# Patient Record
Sex: Male | Born: 1995 | Race: Black or African American | Hispanic: No | Smoking: Never smoker
Health system: Southern US, Community
[De-identification: ages and names within clinical notes are randomized; demographics above are authoritative.]

## PROBLEM LIST (undated history)

## (undated) DIAGNOSIS — T7840XA Allergy, unspecified, initial encounter: Secondary | ICD-10-CM

## (undated) DIAGNOSIS — E785 Hyperlipidemia, unspecified: Secondary | ICD-10-CM

## (undated) DIAGNOSIS — I10 Essential (primary) hypertension: Secondary | ICD-10-CM

## (undated) DIAGNOSIS — F419 Anxiety disorder, unspecified: Secondary | ICD-10-CM

## (undated) DIAGNOSIS — J45909 Unspecified asthma, uncomplicated: Secondary | ICD-10-CM

## (undated) DIAGNOSIS — K219 Gastro-esophageal reflux disease without esophagitis: Secondary | ICD-10-CM

## (undated) HISTORY — DX: Allergy, unspecified, initial encounter: T78.40XA

## (undated) HISTORY — DX: Hyperlipidemia, unspecified: E78.5

## (undated) HISTORY — DX: Gastro-esophageal reflux disease without esophagitis: K21.9

## (undated) HISTORY — DX: Anxiety disorder, unspecified: F41.9

## (undated) HISTORY — PX: UPPER GASTROINTESTINAL ENDOSCOPY: SHX188

## (undated) HISTORY — DX: Essential (primary) hypertension: I10

## (undated) HISTORY — PX: NO PAST SURGERIES: SHX2092

---
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
## 2015-05-30 ENCOUNTER — Emergency Department (HOSPITAL_COMMUNITY)
Admission: EM | Admit: 2015-05-30 | Discharge: 2015-05-30 | Disposition: A | Discharge status: Home / Self Care | Payer: BLUE CROSS/BLUE SHIELD | Attending: Emergency Medicine | Admitting: Emergency Medicine

## 2015-05-30 ENCOUNTER — Encounter (HOSPITAL_COMMUNITY): Payer: Self-pay | Admitting: Emergency Medicine

## 2015-05-30 DIAGNOSIS — R110 Nausea: Secondary | ICD-10-CM | POA: Insufficient documentation

## 2015-05-30 DIAGNOSIS — J45909 Unspecified asthma, uncomplicated: Secondary | ICD-10-CM | POA: Diagnosis not present

## 2015-05-30 DIAGNOSIS — R300 Dysuria: Secondary | ICD-10-CM | POA: Diagnosis not present

## 2015-05-30 DIAGNOSIS — R350 Frequency of micturition: Secondary | ICD-10-CM | POA: Insufficient documentation

## 2015-05-30 DIAGNOSIS — N4889 Other specified disorders of penis: Secondary | ICD-10-CM | POA: Diagnosis present

## 2015-05-30 HISTORY — DX: Unspecified asthma, uncomplicated: J45.909

## 2015-05-30 LAB — URINALYSIS, ROUTINE W REFLEX MICROSCOPIC
Bilirubin Urine: NEGATIVE
Glucose, UA: NEGATIVE mg/dL
Hgb urine dipstick: NEGATIVE
Ketones, ur: 40 mg/dL — AB
Leukocytes, UA: NEGATIVE
Nitrite: NEGATIVE
Protein, ur: NEGATIVE mg/dL
Specific Gravity, Urine: 1.009 (ref 1.005–1.030)
pH: 6.5 (ref 5.0–8.0)

## 2015-05-30 LAB — CBG MONITORING, ED: Glucose-Capillary: 81 mg/dL (ref 65–99)

## 2015-05-30 NOTE — ED Provider Notes (Signed)
CSN: [id number]     Arrival date & time 05/30/15  1921 History  By signing my name below, I, Gonzella Lex, attest that this documentation has been prepared under the direction and in the presence of Sharilyn Sites, PA-C. Electronically Signed: Gonzella Lex, Scribe. 05/30/2015. 9:24 PM.   Chief Complaint  Patient presents with  . Penis Pain   The history is provided by the patient. No language interpreter was used.   HPI Comments: Tarron Krolak is a 20 y.o. male who presents to the Emergency Department complaining of frequent urination, penile pain, and mild dysuria and nausea onset five days ago. Pt notes that he has been urinating between 10-15 times a day or more with a normal amount of urine output each time. He states that he has been drinking a lot of water recently due to suspicion of a possible UTI. No hx of DM.  He also reports that he was screened for DM when he was around the age of 22 but has not been screened since. Pt denies penile discharge, testicle pain, fever, vomiting, diarrhea, new sexual partners, and concern for STD. He also denies a personal medical hx and family hx of DM. Pt was offered STD screening in the ED but refused.   Past Medical History  Diagnosis Date  . Asthma    History reviewed. No pertinent past surgical history. Family History  Problem Relation Age of Onset  . Hypertension Other    Social History  Substance Use Topics  . Smoking status: Never Smoker   . Smokeless tobacco: None  . Alcohol Use: No    Review of Systems  Constitutional: Negative for fever.  Gastrointestinal: Positive for nausea. Negative for vomiting and diarrhea.  Genitourinary: Positive for dysuria and frequency. Negative for discharge.  All other systems reviewed and are negative.  Allergies  Robitussin (alcohol free)  Home Medications   Prior to Admission medications   Not on File   BP 158/88 mmHg  Pulse 69  Temp(Src) 98.5 F (36.9 C) (Oral)  Resp 18   Wt 180 lb (81.647 kg)  SpO2 99%   Physical Exam  Constitutional: He is oriented to person, place, and time. He appears well-developed and well-nourished.  HENT:  Head: Normocephalic and atraumatic.  Mouth/Throat: Oropharynx is clear and moist.  Eyes: Conjunctivae and EOM are normal. Pupils are equal, round, and reactive to light.  Neck: Normal range of motion.  Cardiovascular: Normal rate, regular rhythm and normal heart sounds.   Pulmonary/Chest: Effort normal and breath sounds normal. No respiratory distress.  Abdominal: Soft. Bowel sounds are normal.  Genitourinary:  deferred  Musculoskeletal: Normal range of motion.  Neurological: He is alert and oriented to person, place, and time.  Skin: Skin is warm and dry.  Psychiatric: He has a normal mood and affect.  Nursing note and vitals reviewed.   ED Course  Procedures (including critical care time) DIAGNOSTIC STUDIES:    Oxygen Saturation is 99% on RA, normal by my interpretation.   COORDINATION OF CARE:  9:24 PM Will review urinalysis. Will order POC CBG in the ED. Discussed treatment plan with pt at bedside and pt agreed to plan.   Labs Review Labs Reviewed  URINALYSIS, ROUTINE W REFLEX MICROSCOPIC (NOT AT [facility]) - Abnormal; Notable for the following:    Ketones, ur 40 (*)    All other components within normal limits  CBG MONITORING, ED   I have personally reviewed and evaluated these lab results as part of  my medical decision-making.   MDM   Final diagnoses:  Urinary frequency   20 year old male here with urinary frequency. He reports some mild dysuria. Patient afebrile, nontoxic. UA without any signs of infection, there are ketones noted.  Patient denies any new sexual partners, penile discharge, testicle pain, or abdominal pain. He was offered STD testing, however he declined. His CBG is also within normal limits.  Patient was encouraged to drink water to stay hydrated. He is instructed to follow-up with PCP if  symptoms persist.  Discussed plan with patient, he/she acknowledged understanding and agreed with plan of care.  Return precautions given for new or worsening symptoms.  I personally performed the services described in this documentation, which was scribed in my presence. The recorded information has been reviewed and is accurate.  Garlon HatchetLisa M Adelyna Brockman, PA-C 05/30/15 2145  Leta BaptistEmily Roe Nguyen, MD 06/03/15 [phone]

## 2015-05-30 NOTE — ED Notes (Signed)
Pt is c/o penile pain and urinary frequency  Pt states sxs started on Friday and have progressively gotten worse

## 2015-05-30 NOTE — Discharge Instructions (Signed)
Your urine test and blood sugar today were normal. I do recommend that you continue to drink water regularly to stay hydrated. If symptoms persist, please follow-up with a primary care physician in the area.    Emergency Department Resource Guide 1) Find a Doctor and Pay Out of Pocket Although you won't have to find out who is covered by your insurance plan, it is a good idea to ask around and get recommendations. You will then need to call the office and see if the doctor you have chosen will accept you as a new patient and what types of options they offer for patients who are self-pay. Some doctors offer discounts or will set up payment plans for their patients who do not have insurance, but you will need to ask so you aren't surprised when you get to your appointment.  2) Contact Your Local Health Department Not all health departments have doctors that can see patients for sick visits, but many do, so it is worth a call to see if yours does. If you don't know where your local health department is, you can check in your phone book. The CDC also has a tool to help you locate your state's health department, and many state websites also have listings of all of their local health departments.  3) Find a Walk-in Clinic If your illness is not likely to be very severe or complicated, you may want to try a walk in clinic. These are popping up all over the country in pharmacies, drugstores, and shopping centers. They're usually staffed by nurse practitioners or physician assistants that have been trained to treat common illnesses and complaints. They're usually fairly quick and inexpensive. However, if you have serious medical issues or chronic medical problems, these are probably not your best option.  No Primary Care Doctor: - Call Health Connect at  [phone] - they can help you locate a primary care doctor that  accepts your insurance, provides certain services, etc. - Physician Referral Service-  [phone]  Chronic Pain Problems: Organization         Address  Phone   Notes  [facility]  [phone] Patients need to be referred by their primary care doctor.   Medication Assistance: Organization         Address  Phone   Notes  [facility] [address] [place], [place] [address] [phone]) [phone] --Must be a resident of [facility] [place] -- Must have NO insurance coverage whatsoever (no Medicaid/ Medicare, etc.) -- The pt. MUST have a primary care doctor that directs their care regularly and follows them in the community   MedAssist  [phone]   [employer]  [phone]    Agencies that provide inexpensive medical care: Organization         Address  Phone   Notes  [facility]  [phone]) [phone]   [facility]    [phone]) [phone]   [facility] [address] [place], [place] [address] [phone]) [phone]   [facility] [address], [place] [phone]   [facility]anned Parenthood    [phone]) [phone]   [facility]    [phone]   [facility]  [address], [place] Phone:  [phone], Fax:  [phone] Hours of Operation:  9 am - 6 pm, M-F.  Also accepts Medicaid/Medicare and self-pay.  [place]  [facility] for [place], [address], [place] Phone: [phone], Fax: [phone]. Hours of Operation:  8:30 am - 5:30 pm, M-F.  Also accepts Medicaid and self-pay.  [facility] [place] [address] [place] Phone: [phone]   [place], [place], [place] [phone], Ext. [phone] Mondays & Thursdays: 7-9 AM.  First 15 patients are seen on a first come, first serve basis.    [place] Providers:  Organization         Address  Phone   Notes  [facility] [address],  [place] [phone] Also accepts self-pay patients.  [facility] [id number] [place], [place]  [phone]   [place], [address], [place] [phone]   [facility] [address] [place] [phone]   Lucianne Lei [address], [place]   [phone] Only accepts [place] Access [place] patients after they have their name applied to their card.   Self-Pay (no insurance) in [facility]:  Organization         Address  Phone   Notes  Sickle Cell Patients, [facility] [place] [phone]   [facility] [place] [phone]   [facility] [place]  [place], [place], [place] [phone]   [facility]/Dr. Osei-Bonsu  [address], [place] or [address], [place] [phone] Phone number for both [place] and [place] locations is the same.  [facility] [address] [place] [phone]   [facility] [address], [place] or [address] [phone]   [facility] [address], [place] [phone], phone; [phone], fax Sees patients 1st and 3rd Saturday of every month.  Must not qualify for public or private insurance (i.e. Medicaid, Medicare, [facility] Health Choice, Veterans' Benefits)  Household income should be no more than 200% of the poverty level The clinic cannot treat you if you are pregnant or think you are pregnant  Sexually transmitted diseases are not treated at the clinic.    Dental Care: Organization         Address  Phone  Notes  [facility] Department of [facility] [place] [phone] Accepts children up to age 102 who are enrolled in  [place] or [place]; pregnant women with a Medicaid card; and children who have applied for Medicaid or [place] Health Choice, but were declined, whose parents can pay a reduced fee at time of service.  [facility] Department of [facility]  [address], [place] [phone] Accepts children up to age 74 who are enrolled in [place] or [place]; pregnant women with a Medicaid card; and children who have applied for Medicaid or [place] Health Choice, but were declined, whose parents can pay a reduced fee at time of service.  [facility] Access PRO[facility]  [place] [phone] Patients are seen by appointment only. Walk-ins are not accepted. [place] will see patients 40 years of age and older. Monday - Tuesday (8am-5pm) Most Wednesdays (8:30-5pm) $30 per visit, cash  only  [facility] Adult [facility] Access PROGRAM  [address], [facility] [phone] Patients are seen by appointment only. Walk-ins are not accepted. [place] will see patients 14 years of age and older. One Wednesday Evening (Monthly: Volunteer Based).  $30 per visit, cash only  [place]  [phone] for adults; Children under age 48, call Graduate Pediatric Dentistry at [phone]. Children aged 39-14, please call [phone] to request a pediatric application.  Dental services are provided in all areas of dental care including fillings, crowns and bridges, complete and partial dentures, implants, gum treatment, root canals, and extractions. Preventive care is also provided. Treatment is provided to both adults and children. Patients are selected via a lottery and there is often a waiting list.   [facility] [address] [place]  [phone] [email or web address]   [facility] [address] [place], [place] [phone], Ext. [phone] Second and Fourth Thursday of each month, opens at 6:30  AM; Clinic ends at 9 AM.  Patients are seen on a first-come first-served basis, and a limited number are seen during each clinic.   [facility]  [address] Hillard Danker [place], [place] [phone]   Eligibility Requirements You must have lived in [place], [place], or [place] counties for at least the last three months.   You cannot be eligible for state or federal sponsored [employer], including [employer], [place], or [employer].   You generally cannot be eligible for healthcare insurance through your employer.    How to apply: Eligibility screenings are held every Tuesday and Wednesday afternoon from 1:00 pm until 4:00 pm. You do not need an appointment for the interview!  [facility] [address], [place], [place]   [place]  [place] Department  [place]  [phone]    Behavioral Health Resources in the Community: Intensive Outpatient Programs Organization         Address  Phone  Notes  [place]. [address], [place], [place] [phone]   [facility] Outpatient [address] [place], [place]   ADS: Alcohol & Drug Svcs [address], [place], [place]   [place] [address],  [place], [place] or [phone]   Substance Abuse Resources Organization         Address  Phone  Notes  Alcohol and Drug Services  [phone]   [place]  [phone]   The [place]   Chinita Pester  [phone]   Residential & Outpatient Substance Abuse Program  [phone]   Psychological Services Organization         Address  Phone  Notes  [facility] [place]  [place]  [phone]   [place] [address], [place] or  [phone]    Mobile Crisis Teams Organization         Address  Phone  Notes  [facility], Mobile Crisis Care Unit  [phone]   [facility]  [address] [place], [place]   [place] Levels [address] [place] [phone]    Self-Help/Support Groups Organization         Address  Phone  Notes  Mental Health Assoc. of [place] - variety of support groups  [place] Call for more information  Narcotics Anonymous (NA), Caring Services [address], [employer] [place]  2 meetings at this location   Special educational needs teacher         Address  Phone  Notes  [facility] Residential Treatment [place],    [place]  [phone]   [facility]  [address] [place] [phone], [place], [place]   [place], [place] [phone] Admissions: 8am-3pm M-F  Incentives Substance [place] [address],    [place], [place] [phone]   [facility] [address] [place], [place], [place]   [facility] [address]  [place], [place]   [facility] Programs - Intensive Outpatient [address] Kristeen [address], [place], [place]   [facility] ([place].,  [place], [place] [phone] or [phone]   [facility]idential Treatment Services (RTS) [address], [place], [place] Accepts Medicaid  [facility] [place] [address]  [place] [phone] Substance Abuse/Addiction Treatment   [facility] Organization         Address  Phone  Notes  [facility]  [phone]   Domenic Schwab, PhD [address] Arlis Port[place], [place]   [phone] or [phone]   [place], [place] [phone]   [facility] [address],  [place], [place] [phone] Insurance/Medicaid/sponsorship through [facility] and Families [address], [address]                                    [place], [place] [phone] [place] [address] [place], [place] [phone]    Dr. Adele Schilder  [phone]   [facility] Dept. 1) [address] [place] 2) [place] 3)  [place] [address], [place] [phone]  [phone]   [place] [phone] or [phone] (After Hours)

## 2015-06-01 ENCOUNTER — Encounter: Payer: Self-pay | Admitting: Internal Medicine

## 2015-06-01 ENCOUNTER — Ambulatory Visit: Payer: BLUE CROSS/BLUE SHIELD | Admitting: Internal Medicine

## 2015-06-01 VITALS — BP 128/76 | HR 60 | Temp 98.2°F | Resp 16 | Ht 69.75 in | Wt 181.0 lb

## 2015-06-01 DIAGNOSIS — E559 Vitamin D deficiency, unspecified: Secondary | ICD-10-CM | POA: Insufficient documentation

## 2015-06-01 DIAGNOSIS — E785 Hyperlipidemia, unspecified: Secondary | ICD-10-CM | POA: Insufficient documentation

## 2015-06-01 DIAGNOSIS — R300 Dysuria: Secondary | ICD-10-CM

## 2015-06-01 NOTE — Progress Notes (Signed)
  Subjective:    Patient ID: Matthew Wu, male    DOB: 1996/02/04, 20 y.o.   MRN: [id number]  HPI  This nice 20 yo Single BM presents for f/u from Er visit about 2-3 days ago when he was evaluated for c/o dysuria and penile pain. U/A was negative and he was released for f/u. Denies any rash, fever, chills, penile discharge and admits dysuria sx's have resolved.  (reports not sexually active)   Meds- None   All - None  PMHx - Negative  Review of Systems  10 point systems review negative except as above.    Objective:   Physical Exam  BP 128/76 mmHg  Pulse 60  Temp(Src) 98.2 F (36.8 C)  Resp 16  Ht 5' 9.75" (1.772 m)  Wt 181 lb (82.101 kg)  BMI 26.15 kg/m2  GU exsam - No hernias &  phalus normal w/o signs of lesions, ulcerations, rash or deformity.     Assessment & Plan:   1. Dysuria, rresolved  - reassured  - advised of sx's to return for.   (No charge OV)

## 2015-06-18 ENCOUNTER — Encounter: Payer: Self-pay | Admitting: Emergency Medicine

## 2015-06-18 ENCOUNTER — Emergency Department
Admission: EM | Admit: 2015-06-18 | Discharge: 2015-06-18 | Disposition: A | Discharge status: Home / Self Care | Payer: BLUE CROSS/BLUE SHIELD | Attending: Emergency Medicine | Admitting: Emergency Medicine

## 2015-06-18 ENCOUNTER — Emergency Department: Payer: BLUE CROSS/BLUE SHIELD

## 2015-06-18 DIAGNOSIS — Y998 Other external cause status: Secondary | ICD-10-CM | POA: Insufficient documentation

## 2015-06-18 DIAGNOSIS — Y9389 Activity, other specified: Secondary | ICD-10-CM | POA: Insufficient documentation

## 2015-06-18 DIAGNOSIS — S51811A Laceration without foreign body of right forearm, initial encounter: Secondary | ICD-10-CM | POA: Insufficient documentation

## 2015-06-18 DIAGNOSIS — S134XXA Sprain of ligaments of cervical spine, initial encounter: Secondary | ICD-10-CM | POA: Insufficient documentation

## 2015-06-18 DIAGNOSIS — S46911A Strain of unspecified muscle, fascia and tendon at shoulder and upper arm level, right arm, initial encounter: Secondary | ICD-10-CM | POA: Insufficient documentation

## 2015-06-18 DIAGNOSIS — IMO0002 Reserved for concepts with insufficient information to code with codable children: Secondary | ICD-10-CM

## 2015-06-18 DIAGNOSIS — S199XXA Unspecified injury of neck, initial encounter: Secondary | ICD-10-CM | POA: Diagnosis present

## 2015-06-18 DIAGNOSIS — Y92410 Unspecified street and highway as the place of occurrence of the external cause: Secondary | ICD-10-CM | POA: Diagnosis not present

## 2015-06-18 NOTE — ED Notes (Signed)
Spoke to dr Derrill Kay r/t pt. Will hold off imaging orders until seen by EDP

## 2015-06-18 NOTE — ED Notes (Signed)
Pt refusing wheelchair. 
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
## 2015-06-18 NOTE — ED Notes (Signed)
philly collar applied in triage
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
## 2015-06-26 ENCOUNTER — Telehealth: Payer: Self-pay | Admitting: Internal Medicine

## 2015-06-26 NOTE — Telephone Encounter (Signed)
Patient recently in [facility] and wants to know if we recommend chiropractors.  Don't recommend chiropractors as this is not a likely modality that will relieve pain.  He can see [place] here in office and we can send to ortho or can refer to PT.

## 2015-06-26 NOTE — Telephone Encounter (Signed)
LVM for pt to return my call.

## 2015-06-28 NOTE — Telephone Encounter (Signed)
LVM for pt to return office call. 

## 2015-06-28 NOTE — Telephone Encounter (Signed)
Unable to reach pt waiting on pt to return office call

## 2018-01-30 ENCOUNTER — Other Ambulatory Visit: Payer: Self-pay

## 2018-01-30 ENCOUNTER — Emergency Department: Payer: Self-pay

## 2018-01-30 ENCOUNTER — Emergency Department
Admission: EM | Admit: 2018-01-30 | Discharge: 2018-01-30 | Disposition: A | Discharge status: Home / Self Care | Payer: Self-pay | Attending: Emergency Medicine | Admitting: Emergency Medicine

## 2018-01-30 DIAGNOSIS — K219 Gastro-esophageal reflux disease without esophagitis: Secondary | ICD-10-CM | POA: Insufficient documentation

## 2018-01-30 DIAGNOSIS — J45909 Unspecified asthma, uncomplicated: Secondary | ICD-10-CM | POA: Insufficient documentation

## 2018-01-30 DIAGNOSIS — R002 Palpitations: Secondary | ICD-10-CM | POA: Insufficient documentation

## 2018-01-30 DIAGNOSIS — F410 Panic disorder [episodic paroxysmal anxiety] without agoraphobia: Secondary | ICD-10-CM | POA: Insufficient documentation

## 2018-01-30 LAB — CBC
HEMATOCRIT: 44.2 % (ref 40.0–52.0)
Hemoglobin: 15.2 g/dL (ref 13.0–18.0)
MCH: 31 pg (ref 26.0–34.0)
MCHC: 34.4 g/dL (ref 32.0–36.0)
MCV: 90 fL (ref 80.0–100.0)
PLATELETS: 253 10*3/uL (ref 150–440)
RBC: 4.91 MIL/uL (ref 4.40–5.90)
RDW: 13.1 % (ref 11.5–14.5)
WBC: 10.1 10*3/uL (ref 3.8–10.6)

## 2018-01-30 LAB — BASIC METABOLIC PANEL
Anion gap: 8 (ref 5–15)
BUN: 15 mg/dL (ref 6–20)
CHLORIDE: 104 mmol/L (ref 98–111)
CO2: 29 mmol/L (ref 22–32)
CREATININE: 0.87 mg/dL (ref 0.61–1.24)
Calcium: 9 mg/dL (ref 8.9–10.3)
Glucose, Bld: 137 mg/dL — ABNORMAL HIGH (ref 70–99)
POTASSIUM: 3.8 mmol/L (ref 3.5–5.1)
SODIUM: 141 mmol/L (ref 135–145)

## 2018-01-30 LAB — TROPONIN I: Troponin I: <0.03 ng/mL (ref <0.03)

## 2018-01-30 NOTE — ED Provider Notes (Signed)
[facility] Emergency Department Provider Note ____________________________________________   I have reviewed the triage vital signs and the triage nursing note.  HISTORY  Chief Complaint Tachycardia   Historian Patient  HPI Matthew Wu is a 22 y.o. male presenting for evaluation of episodes where he feels like his heart races.  He works night shift at [employer] and states that it is stressful.  He is also expecting a new baby soon.  He states that without known inciting event he will feel his heart race and feel nauseated sometimes feel epigastric discomfort.  He thought that he may have acid reflux because sometimes his stomach burns.  He feels okay right now.  He had several of these episodes yesterday which he thought may have been related to "feeling sick.  "No additional consultation or diarrhea.  No fevers.  No shortness breath or trouble breathing.  States he gets lightheaded when this happens but no true syncope.  No chest pain.     Past Medical History:  Diagnosis Date  . Asthma     Patient Active Problem List   Diagnosis Date Noted  . Vitamin D deficiency 06/01/2015  . Hyperlipidemia 06/01/2015    No past surgical history on file.  Prior to Admission medications   Not on File    Allergies  Allergen Reactions  . Robitussin (Alcohol Free) [Guaifenesin] Nausea And Vomiting    Family History  Problem Relation Age of Onset  . Hypertension Other     Social History Social History   Tobacco Use  . Smoking status: Never Smoker  Substance Use Topics  . Alcohol use: No  . Drug use: No    Review of Systems  Constitutional: Negative for fever. Eyes: Negative for visual changes. ENT: Negative for sore throat. Cardiovascular: Negative for chest pain.  Heart racing as per HPI. Respiratory: Negative for shortness of breath. Gastrointestinal: Positive for epigastric discomfort intermittently without right upper quadrant  pain. Genitourinary: Negative for dysuria. Musculoskeletal: Negative for back pain. Skin: Negative for rash. Neurological: Negative for headache.  ____________________________________________   PHYSICAL EXAM:  VITAL SIGNS: ED Triage Vitals  Enc Vitals Group     BP 01/30/18 0417 (!) 160/102     Pulse Rate 01/30/18 0417 90     Resp 01/30/18 0417 16     Temp 01/30/18 0417 98.3 F (36.8 C)     Temp Source 01/30/18 0417 Oral     SpO2 01/30/18 0417 100 %     Weight 01/30/18 0418 185 lb (83.9 kg)     Height 01/30/18 0418 5\' 9"  (1.753 m)     Head Circumference --      Peak Flow --      Pain Score 01/30/18 0418 5     Pain Loc --      Pain Edu? --      Excl. in GC? --      Constitutional: Alert and oriented.  HEENT      Head: Normocephalic and atraumatic.      Eyes: Conjunctivae are normal. Pupils equal and round.       Ears:         Nose: No congestion/rhinnorhea.      Mouth/Throat: Mucous membranes are moist.      Neck: No stridor. Cardiovascular/Chest: Normal rate, regular rhythm.  No murmurs, rubs, or gallops. Respiratory: Normal respiratory effort without tachypnea nor retractions. Breath sounds are clear and equal bilaterally. No wheezes/rales/rhonchi. Gastrointestinal: Soft. No distention, no guarding, no rebound.  Nontender.    Genitourinary/rectal:Deferred Musculoskeletal: Nontender with normal range of motion in all extremities. No joint effusions.  No lower extremity tenderness.  No edema. Neurologic:  Normal speech and language. No gross or focal neurologic deficits are appreciated. Skin:  Skin is warm, dry and intact. No rash noted. Psychiatric: Reports being somewhat anxious.  Here mood and affect are normal. Speech and behavior are normal. Patient exhibits appropriate insight and judgment.   ____________________________________________  LABS (pertinent positives/negatives) I, Governor Rooksebecca Keldon Lassen, MD the attending physician have reviewed the labs noted below.  Labs  Reviewed  BASIC METABOLIC PANEL - Abnormal; Notable for the following components:      Result Value   Glucose, Bld 137 (*)    All other components within normal limits  CBC  TROPONIN I    ____________________________________________    EKG I, Governor Rooksebecca Arlynn Mcdermid, MD, the attending physician have personally viewed and interpreted all ECGs.  70 bpm.  Normal sinus rhythm with sinus arrhythmia.  Narrow QRS.  Normal axis.  Normal ST and T wave ____________________________________________  RADIOLOGY   Chest x-ray two-view, viewed by me, and I reviewed the radiologist interpretation which is negative for any acute findings. __________________________________________  PROCEDURES  Procedure(s) performed: None  Procedures  Critical Care performed: None   ____________________________________________  ED COURSE / ASSESSMENT AND PLAN  Pertinent labs & imaging results that were available during my care of the patient were reviewed by me and considered in my medical decision making (see chart for details).    Patient reports intermittent episodes of tachycardia along with some symptoms of nausea and occasional dizziness, and we talked through possibilities, does not seem like ACS, laboratory studies are reassuring in terms of no anemia or electrolyte disturbances.  Feel less suspicious for cardiac arrhythmia, however will recommend that he follow-up with cardiology to discuss doing a Holter monitor.  He does describe some symptoms that seem consistent with GERD, and we discussed 2 weeks symptomatic management in terms of dietary changes, over-the-counter Maalox and Prilosec.  I am going refer him to primary care for follow-up, he does not have a primary care doctor right now and does have insurance I will refer him both to the [facility] as well as [facility] for him to choose.  It seems like the most likely thing that explains his entire scenario is panic attacks/stress.  Patient  states that he thinks this probably is panic attacks and he is under quite a bit of stress right now, but no suicidal thoughts or active depression.  We discussed some strategies, as well as I am recommending that he follow-up with primary care as well as I will provide behavioral medicine number for follow-up to [facility].     CONSULTATIONS:   None   Patient / Family / Caregiver informed of clinical course, medical decision-making process, and agree with plan.   I discussed return precautions, follow-up instructions, and discharge instructions with patient and/or family.  Discharge Instructions : Your exam and evaluation are overall reassuring, and as we discussed I am most suspicious that your symptoms are coming from panic attacks and please follow-up with primary care doctor as well as behavioral medicine, [facility] number is provided.  We also discussed likely some symptoms of acid reflux/GERD.  Avoid spicy foods, caffeine, fruits and tomatoes, and please take over-the-counter Maalox as needed on labeling for symptoms.  Please take over-the-counter Prilosec 40 mg once daily for 10 to 14 days.  We discussed because of the heart racing/palpitations,  I am recommending that you follow-up with a cardiologist to discuss whether to try a heart monitor to capture the rhythm.  Return to the emergency room immediately for any worsening condition including weakness, numbness, vomiting blood, black or bloody stool, chest pain, trouble breathing, fever, dizziness or passing out, or any other symptoms concerning to you.    ___________________________________________   FINAL CLINICAL IMPRESSION(S) / ED DIAGNOSES   Final diagnoses:  Palpitations  Panic attack  Gastroesophageal reflux disease, esophagitis presence not specified      ___________________________________________         Note: This dictation was prepared with Dragon dictation. Any transcriptional errors that result from this  process are unintentional    Governor Rooks, MD 01/30/18 [phone]

## 2018-01-30 NOTE — ED Triage Notes (Signed)
Pt states "I get real bad reflux and it makes my heart race". Pt complains of epigastric pain and nausea. Pt appears in no acute distress. Pt states has had symptoms for over three weeks.

## 2018-01-30 NOTE — ED Notes (Signed)
Patient c/o intermittent palpitations and tachycardia X 2-3 weeks. Patient reports the onset usually occurs when he is attempting to sleep or relax. Patient reports accompanying symptoms of chest tightness/burning, nausea, SOB, and weakness. Patient reports that today he felt like he was coming down with some type of infection. Report episode of near syncope.

## 2018-01-30 NOTE — ED Notes (Signed)
Pt alert and oriented X4, active, cooperative, pt in NAD. RR even and unlabored, color WNL.  Pt informed to return if any life threatening symptoms occur.  Discharge and followup instructions reviewed.  

## 2018-01-30 NOTE — Discharge Instructions (Addendum)
Your exam and evaluation are overall reassuring, and as we discussed I am most suspicious that your symptoms are coming from panic attacks and please follow-up with primary care doctor as well as behavioral medicine, [facility] number is provided.  We also discussed likely some symptoms of acid reflux/GERD.  Avoid spicy foods, caffeine, fruits and tomatoes, and please take over-the-counter Maalox as needed on labeling for symptoms.  Please take over-the-counter Prilosec 40 mg once daily for 10 to 14 days.  We discussed because of the heart racing/palpitations, I am recommending that you follow-up with a cardiologist to discuss whether to try a heart monitor to capture the rhythm.  Return to the emergency room immediately for any worsening condition including weakness, numbness, vomiting blood, black or bloody stool, chest pain, trouble breathing, fever, dizziness or passing out, or any other symptoms concerning to you.

## 2018-01-30 NOTE — ED Notes (Signed)
ED Provider at bedside. 

## 2018-05-03 DIAGNOSIS — Z9109 Other allergy status, other than to drugs and biological substances: Secondary | ICD-10-CM | POA: Insufficient documentation

## 2018-06-04 ENCOUNTER — Encounter (HOSPITAL_COMMUNITY): Payer: Self-pay | Admitting: Emergency Medicine

## 2018-06-04 ENCOUNTER — Emergency Department (HOSPITAL_COMMUNITY): Payer: Self-pay

## 2018-06-04 ENCOUNTER — Emergency Department (HOSPITAL_COMMUNITY)
Admission: EM | Admit: 2018-06-04 | Discharge: 2018-06-04 | Disposition: A | Discharge status: Home / Self Care | Payer: Self-pay | Attending: Emergency Medicine | Admitting: Emergency Medicine

## 2018-06-04 ENCOUNTER — Other Ambulatory Visit: Payer: Self-pay

## 2018-06-04 DIAGNOSIS — R200 Anesthesia of skin: Secondary | ICD-10-CM | POA: Insufficient documentation

## 2018-06-04 DIAGNOSIS — R202 Paresthesia of skin: Secondary | ICD-10-CM | POA: Insufficient documentation

## 2018-06-04 DIAGNOSIS — R51 Headache: Secondary | ICD-10-CM | POA: Insufficient documentation

## 2018-06-04 DIAGNOSIS — J45909 Unspecified asthma, uncomplicated: Secondary | ICD-10-CM | POA: Insufficient documentation

## 2018-06-04 LAB — CBC WITH DIFFERENTIAL/PLATELET
ABS IMMATURE GRANULOCYTES: 0.03 10*3/uL (ref 0.00–0.07)
BASOS ABS: 0 10*3/uL (ref 0.0–0.1)
BASOS PCT: 1 %
EOS ABS: 0.2 10*3/uL (ref 0.0–0.5)
Eosinophils Relative: 3 %
HCT: 46.9 % (ref 39.0–52.0)
Hemoglobin: 15.3 g/dL (ref 13.0–17.0)
Immature Granulocytes: 0 %
Lymphocytes Relative: 38 %
Lymphs Abs: 2.9 10*3/uL (ref 0.7–4.0)
MCH: 29.3 pg (ref 26.0–34.0)
MCHC: 32.6 g/dL (ref 30.0–36.0)
MCV: 89.7 fL (ref 80.0–100.0)
MONOS PCT: 9 %
Monocytes Absolute: 0.7 10*3/uL (ref 0.1–1.0)
NEUTROS ABS: 3.7 10*3/uL (ref 1.7–7.7)
NEUTROS PCT: 49 %
NRBC: 0 % (ref 0.0–0.2)
Platelets: 294 10*3/uL (ref 150–400)
RBC: 5.23 MIL/uL (ref 4.22–5.81)
RDW: 12.2 % (ref 11.5–15.5)
WBC: 7.6 10*3/uL (ref 4.0–10.5)

## 2018-06-04 LAB — BASIC METABOLIC PANEL
ANION GAP: 9 (ref 5–15)
BUN: 10 mg/dL (ref 6–20)
CALCIUM: 9.4 mg/dL (ref 8.9–10.3)
CO2: 25 mmol/L (ref 22–32)
Chloride: 105 mmol/L (ref 98–111)
Creatinine, Ser: 0.93 mg/dL (ref 0.61–1.24)
Glucose, Bld: 95 mg/dL (ref 70–99)
POTASSIUM: 3.9 mmol/L (ref 3.5–5.1)
Sodium: 139 mmol/L (ref 135–145)

## 2018-06-04 LAB — MAGNESIUM: MAGNESIUM: 2 mg/dL (ref 1.7–2.4)

## 2018-06-04 NOTE — ED Notes (Addendum)
No focal weakness noted on exam.  Pt is ambulatory to triage. Cap refill <3 sec

## 2018-06-04 NOTE — ED Provider Notes (Addendum)
[facility] EMERGENCY DEPARTMENT Provider Note   CSN: [id number] Arrival date & time: 06/04/18  1059     History   Chief Complaint Chief Complaint  Patient presents with  . Weakness    HPI Matthew Wu is a 23 y.o. male.  23 year old male with prior medical history as detailed below presents for evaluation of intermittent numbness and tingling.  His symptoms have been ongoing for at least the last 6 to 10 weeks.  Patient reports intermittent tingling in both hands.  He reports intermittent tingling into the legs as well.  The symptoms are occasionally associated with headache.  He is currently without complaint.  He denies associated weakness.     The history is provided by the patient and medical records.  Illness  Location:  Paresthesia Severity:  Mild Onset quality:  Gradual Duration:  8 weeks Timing:  Intermittent Progression:  Waxing and waning Chronicity:  Chronic Associated symptoms: no chest pain     Past Medical History:  Diagnosis Date  . Asthma     Patient Active Problem List   Diagnosis Date Noted  . Vitamin D deficiency 06/01/2015  . Hyperlipidemia 06/01/2015    History reviewed. No pertinent surgical history.      Home Medications    Prior to Admission medications   Not on File    Family History Family History  Problem Relation Age of Onset  . Hypertension Other     Social History Social History   Tobacco Use  . Smoking status: Never Smoker  Substance Use Topics  . Alcohol use: No  . Drug use: No     Allergies   Robitussin (alcohol free) [guaifenesin]   Review of Systems Review of Systems  Cardiovascular: Negative for chest pain.  All other systems reviewed and are negative.    Physical Exam Updated Vital Signs BP (!) 172/118 (BP Location: Right Arm)   Pulse (!) 103   Temp 98.2 F (36.8 C) (Oral)   Resp 18   Ht 5\' 9"  (1.753 m)   Wt 89.8 kg   SpO2 100%   BMI 29.24 kg/m   Physical  Exam Vitals signs and nursing note reviewed.  Constitutional:      General: He is not in acute distress.    Appearance: Normal appearance. He is well-developed.  HENT:     Head: Normocephalic and atraumatic.  Eyes:     Conjunctiva/sclera: Conjunctivae normal.     Pupils: Pupils are equal, round, and reactive to light.  Neck:     Musculoskeletal: Normal range of motion and neck supple.  Cardiovascular:     Rate and Rhythm: Normal rate and regular rhythm.     Heart sounds: Normal heart sounds.  Pulmonary:     Effort: Pulmonary effort is normal. No respiratory distress.     Breath sounds: Normal breath sounds.  Abdominal:     General: Abdomen is flat. There is no distension.     Palpations: Abdomen is soft.     Tenderness: There is no abdominal tenderness.  Musculoskeletal: Normal range of motion.        General: No deformity.  Skin:    General: Skin is warm and dry.  Neurological:     General: No focal deficit present.     Mental Status: He is alert and oriented to person, place, and time. Mental status is at baseline.     Cranial Nerves: No cranial nerve deficit.     Sensory: No sensory deficit.  Motor: No weakness.     Coordination: Coordination normal.     Gait: Gait normal.     Deep Tendon Reflexes: Reflexes normal.  Psychiatric:        Mood and Affect: Mood normal.      ED Treatments / Results  Labs (all labs ordered are listed, but only abnormal results are displayed) Labs Reviewed  BASIC METABOLIC PANEL  CBC WITH DIFFERENTIAL/PLATELET  MAGNESIUM    EKG None  Radiology Ct Head Wo Contrast  Result Date: 06/04/2018 CLINICAL DATA:  Headache, no known trauma EXAM: CT HEAD WITHOUT CONTRAST TECHNIQUE: Contiguous axial images were obtained from the base of the skull through the vertex without intravenous contrast. Sagittal and coronal MPR images reconstructed from axial data set. COMPARISON:  06/18/2015 FINDINGS: Brain: Normal ventricular morphology. No midline  shift or mass effect. Normal appearance of brain parenchyma. No intracranial hemorrhage, mass lesion, evidence of acute infarction, or extra-axial fluid collection. Vascular: Normal appearance Skull: Intact Sinuses/Orbits: Clear Other: N/A IMPRESSION: Normal exam. Electronically Signed   By: Ulyses Southward M.D.   On: 06/04/2018 13:29    Procedures Procedures (including critical care time)  Medications Ordered in ED Medications - No data to display   Initial Impression / Assessment and Plan / ED Course  I have reviewed the triage vital signs and the nursing notes.  Pertinent labs & imaging results that were available during my care of the patient were reviewed by me and considered in my medical decision making (see chart for details).     MDM  Screen complete  Patient is presenting with complaint of longstanding issue with transient paresthesias.  Patient is currently without symptoms.  Screening labs obtained in the ED including CT of the head are without significant abnormality.  Patient understands need for close follow-up both with his regular care provider and also with neurology.  Patient has questions about his blood pressure.  He is advised to keep a blood pressure diary at home and to follow-up with his regular care provider about same.  Patient without evidence of endorgan damage from elevated blood pressure in the ED today.  Patient does have some evidence of anxiety.  He is advised that if his anxiety symptoms worsen he should absolutely seek assistance from his regular care provider.  He is always welcome to come to the ED for further evaluation as well.  Importance of close follow-up was repeatedly stressed.  Strict return precautions given and understood.    Final Clinical Impressions(s) / ED Diagnoses   Final diagnoses:  Paresthesias    ED Discharge Orders    None       Wynetta Fines, MD 06/04/18 1350    Wynetta Fines, MD 06/04/18 1350

## 2018-06-04 NOTE — ED Notes (Signed)
Patient verbalizes understanding of discharge instructions. Opportunity for questioning and answers were provided. Armband removed by staff, pt discharged from ED.  

## 2018-06-04 NOTE — ED Notes (Signed)
Patient transported to CT 

## 2018-06-04 NOTE — ED Triage Notes (Signed)
Pt arrives to ED from home with complaints of dizziness since this morning. Pt reports he's been numb and weak in his legs and arms on and off since August. Pt stated the numbness and weakness in his legs was worse than it normally is. Pt placed in position of comfort with bed locked and lowered, call bell in reach.

## 2018-06-04 NOTE — Discharge Instructions (Addendum)
Please return for any problem.  Follow-up with your regular care provider and with neurology as instructed.  Please keep track of your blood pressure at home with a diary as instructed.

## 2018-06-17 NOTE — Progress Notes (Deleted)
FOLLOW UP  Assessment and Plan:    Continue diet and meds as discussed. Further disposition pending results of labs. Discussed med's effects and SE's.   Over 30 minutes of exam, counseling, chart review, and critical decision making was performed.   Future Appointments  Date Time Provider Department Center  06/21/2018 10:00 AM Judd Gaudier, NP [facility] None    ----------------------------------------------------------------------------------------------------------------------  HPI 23 y.o. male with hx of asthma, hyperlipidemia, lost to follow up and last seen in 05/2015 in this office presents to re-establish care and for ER follow up.   He presented to ED on 06/04/2018  for evaluation of intermittent numbness and tingling of bilateral hands and intermittent tingling in legs as well, reportedly ongoing for 6-10 weeks at that time. He reported the symptoms were occasionally associated with HA, but denied associated weakness. CBC, BMP and magnesium levels were normal at that time. CT head w/o contrast was normal without intracranial hemorrhage, mass lesion, evidence of acute infarction, or extra-axial fluid collection. Vasculature appeared normal as well. BP was noted to be elevated at that time,   Recommended follow up with [place] for BP management and follow up, and recommended follow up with neurology ***  ? Too much zinc   Tick exposure ?  Sed /autoimmune?   BMI is There is no height or weight on file to calculate BMI., he {HAS HAS HYI:50277} been working on diet and exercise. Wt Readings from Last 3 Encounters:  06/04/18 198 lb (89.8 kg)  01/30/18 185 lb (83.9 kg)  06/18/15 181 lb (82.1 kg)    His blood pressure {HAS HAS NOT:18834} been controlled at home, today their BP is    He {DOES_DOES AJO:87867} workout. He denies chest pain, shortness of breath, dizziness.   He {ACTION; IS/IS EHM:09470962} on cholesterol medication {Cholesterol meds:21887} and denies myalgias. His  cholesterol {ACTION; IS/IS NOT:21021397} at goal. The cholesterol last visit was:  No results found for: CHOL, HDL, LDLCALC, LDLDIRECT, TRIG, CHOLHDL  He {Has/has not:18111} been working on diet and exercise for prediabetes, and denies {Symptoms; diabetes w/o none:19199}. Last A1C in the office was: No results found for: HGBA1C Patient is on Vitamin D supplement.   No results found for: VD25OH     Ct Head Wo Contrast  Result Date: 06/04/2018 CLINICAL DATA:  Headache, no known trauma EXAM: CT HEAD WITHOUT CONTRAST TECHNIQUE: Contiguous axial images were obtained from the base of the skull through the vertex without intravenous contrast. Sagittal and coronal MPR images reconstructed from axial data set. COMPARISON:  06/18/2015 FINDINGS: Brain: Normal ventricular morphology. No midline shift or mass effect. Normal appearance of brain parenchyma. No intracranial hemorrhage, mass lesion, evidence of acute infarction, or extra-axial fluid collection. Vascular: Normal appearance Skull: Intact Sinuses/Orbits: Clear Other: N/A IMPRESSION: Normal exam. Electronically Signed   By: Ulyses Southward M.D.   On: 06/04/2018 13:29    Current Medications:  No current outpatient medications on file prior to visit.   No current facility-administered medications on file prior to visit.      Allergies:  Allergies  Allergen Reactions  . Robitussin (Alcohol Free) [Guaifenesin] Nausea And Vomiting     Medical History:  Past Medical History:  Diagnosis Date  . Asthma    Family history- Reviewed and unchanged Social history- Reviewed and unchanged   Review of Systems:  ROS    Physical Exam: There were no vitals taken for this visit. Wt Readings from Last 3 Encounters:  06/04/18 198 lb (89.8 kg)  01/30/18 185  lb (83.9 kg)  06/18/15 181 lb (82.1 kg)   General Appearance: Well nourished, in no apparent distress. Eyes: PERRLA, EOMs, conjunctiva no swelling or erythema Sinuses: No Frontal/maxillary  tenderness ENT/Mouth: Ext aud canals clear, TMs without erythema, bulging. No erythema, swelling, or exudate on post pharynx.  Tonsils not swollen or erythematous. Hearing normal.  Neck: Supple, thyroid normal.  Respiratory: Respiratory effort normal, BS equal bilaterally without rales, rhonchi, wheezing or stridor.  Cardio: RRR with no MRGs. Brisk peripheral pulses without edema.  Abdomen: Soft, + BS.  Non tender, no guarding, rebound, hernias, masses. Lymphatics: Non tender without lymphadenopathy.  Musculoskeletal: Full ROM, 5/5 strength, {PSY - GAIT AND STATION:22860} gait Skin: Warm, dry without rashes, lesions, ecchymosis.  Neuro: Cranial nerves intact. No cerebellar symptoms.  Psych: Awake and oriented X 3, normal affect, Insight and Judgment appropriate.    Dan Maker, NP 9:34 AM [facility]

## 2018-06-21 ENCOUNTER — Ambulatory Visit: Payer: Self-pay | Admitting: Adult Health

## 2018-07-15 ENCOUNTER — Encounter: Payer: Self-pay | Admitting: Adult Health

## 2018-07-15 ENCOUNTER — Ambulatory Visit (INDEPENDENT_AMBULATORY_CARE_PROVIDER_SITE_OTHER): Payer: Self-pay | Admitting: Adult Health

## 2018-07-15 VITALS — BP 130/80 | HR 63 | Temp 97.7°F | Wt 197.0 lb

## 2018-07-15 DIAGNOSIS — K219 Gastro-esophageal reflux disease without esophagitis: Secondary | ICD-10-CM | POA: Insufficient documentation

## 2018-07-15 DIAGNOSIS — R42 Dizziness and giddiness: Secondary | ICD-10-CM | POA: Insufficient documentation

## 2018-07-15 DIAGNOSIS — F419 Anxiety disorder, unspecified: Secondary | ICD-10-CM | POA: Insufficient documentation

## 2018-07-15 DIAGNOSIS — I10 Essential (primary) hypertension: Secondary | ICD-10-CM | POA: Insufficient documentation

## 2018-07-15 MED ORDER — CITALOPRAM HYDROBROMIDE 20 MG PO TABS: 20.0000 mg | ORAL_TABLET | Freq: Every day | ORAL | 1 refills | Status: DC

## 2018-07-15 MED ORDER — MECLIZINE HCL 25 MG PO TABS: ORAL_TABLET | ORAL | 1 refills | Status: DC

## 2018-07-15 NOTE — Progress Notes (Signed)
Assessment and Plan:  Matthew Wu was seen today for dizziness.  Diagnoses and all orders for this visit:  Dizziness, nonspecific Unclear etilogy; has had neg head CT Only having with supine, ? Associated with emotional lability, anxiety Will try meclizine, treat anxiety and will follow up closely Patient is self pay, declines recommended neurology follow up patient to go to ER if there is weakness, thunderclap headache, visual changes, or any concerning factors -     meclizine (ANTIVERT) 25 MG tablet; 1/2-1 pill up to 3 times daily for motion sickness/dizziness  Hypertension, unspecified type Currently with mild elevations borderline; not currently on medication Monitor closely, consider BB for heart rate control and benefit with anxiety Monitor blood pressure at home; call if consistently over 130/80 Continue DASH diet.   Reminder to go to the ER if any CP, SOB, nausea, dizziness, severe HA, changes vision/speech, left arm numbness and tingling and jaw pain.  Anxiety Start new medication as prescribed Stress management techniques discussed, increase water, good sleep hygiene discussed, increase exercise, and increase veggies.  Follow up 6-8 weeks, call the office if any new AE's from medications and we will switch them -     citalopram (CELEXA) 20 MG tablet; Take 1 tablet (20 mg total) by mouth daily.   Further disposition pending results of labs. Discussed med's effects and SE's.   Over 15 minutes of exam, counseling, chart review, and critical decision making was performed.   Future Appointments  Date Time Provider Department Center  09/09/2018 10:45 AM Judd Gaudier, NP [facility] None    ------------------------------------------------------------------------------------------------------------------   HPI BP 130/80   Pulse 63   Temp 97.7 F (36.5 C)   Wt 197 lb (89.4 kg)   SpO2 98%   BMI 29.09 kg/m   23 y.o.male with hx of hyperlipidemia, reflux presents for  evaluation of episodes of dizziness, feeling off balance, with some nausea; also having intermittent tinnitis bilaterally. He reports dizziness is worst when he is falling asleep or if he wakes up suddenly during the night when he is supine. He reports this sometimes feels as though the room is spinning. He reports rarely having symptoms during the day, doesn't note association with changes in position.   He reports intermittent headaches, typically bilateral, frontal, achy, he associates these with episodes of emotional lability. He been seen in ED for palpitations, parasthesias, headaches with unremarkable workups including CT on 06/04/2018 and 01/30/2018, was provided with referral to neurology to follow up but patient has not done so due to lack of insurance. He reports these issues are significantly improved.   He does report anxiety has been a significant issue recently, first child (girl) was born recently. He commonly feels anxious, will wake up with sensation of pounding heart (denies chest pain, racing heart, dyspnea). He has not been on medications for this.    Past Medical History:  Diagnosis Date  . Asthma      Allergies  Allergen Reactions  . Robitussin (Alcohol Free) [Guaifenesin] Nausea And Vomiting    Current Outpatient Medications on File Prior to Visit  Medication Sig  . Calcium Carbonate Antacid (TUMS PO) Take by mouth as needed.   No current facility-administered medications on file prior to visit.     ROS: all negative except above.   Physical Exam:  BP 130/80   Pulse 63   Temp 97.7 F (36.5 C)   Wt 197 lb (89.4 kg)   SpO2 98%   BMI 29.09 kg/m   General Appearance:  Well nourished, in no apparent distress. Eyes: PERRLA, EOMs, conjunctiva no swelling or erythema Sinuses: No Frontal/maxillary tenderness ENT/Mouth: Ext aud canals clear, TMs without erythema, bulging. No erythema, swelling, or exudate on post pharynx.  Tonsils not swollen or erythematous.  Hearing normal.  Neck: Supple, thyroid normal.  Respiratory: Respiratory effort normal, BS equal bilaterally without rales, rhonchi, wheezing or stridor.  Cardio: RRR with no MRGs. Brisk peripheral pulses without edema.  Abdomen: Soft, + BS.  Non tender, no guarding, rebound, hernias, masses. Lymphatics: Non tender without lymphadenopathy.  Musculoskeletal: Full ROM, 5/5 strength, normal gait.  Skin: Warm, dry without rashes, lesions, ecchymosis.  Neuro: Cranial nerves intact. Normal muscle tone, no cerebellar symptoms. Sensation intact. Dizziness not elicited during exam.  Psych: Awake and oriented X 3, normal affect, Insight and Judgment appropriate.     Dan Maker, NP 12:35 PM [facility]

## 2018-07-15 NOTE — Patient Instructions (Signed)
Dizziness Dizziness is a common problem. It is a feeling of unsteadiness or light-headedness. You may feel like you are about to faint. Dizziness can lead to injury if you stumble or fall. Anyone can become dizzy, but dizziness is more common in older adults. This condition can be caused by a number of things, including medicines, dehydration, or illness. Follow these instructions at home: Eating and drinking  Drink enough fluid to keep your urine clear or pale yellow. This helps to keep you from becoming dehydrated. Try to drink more clear fluids, such as water.  Do not drink alcohol.  Limit your caffeine intake if told to do so by your health care provider. Check ingredients and nutrition facts to see if a food or beverage contains caffeine.  Limit your salt (sodium) intake if told to do so by your health care provider. Check ingredients and nutrition facts to see if a food or beverage contains sodium. Activity  Avoid making quick movements. ? Rise slowly from chairs and steady yourself until you feel okay. ? In the morning, first sit up on the side of the bed. When you feel okay, stand slowly while you hold onto something until you know that your balance is fine.  If you need to stand in one place for a long time, move your legs often. Tighten and relax the muscles in your legs while you are standing.  Do not drive or use heavy machinery if you feel dizzy.  Avoid bending down if you feel dizzy. Place items in your home so that they are easy for you to reach without leaning over. Lifestyle  Do not use any products that contain nicotine or tobacco, such as cigarettes and e-cigarettes. If you need help quitting, ask your health care provider.  Try to reduce your stress level by using methods such as yoga or meditation. Talk with your health care provider if you need help to manage your stress. General instructions  Watch your dizziness for any changes.  Take over-the-counter and  prescription medicines only as told by your health care provider. Talk with your health care provider if you think that your dizziness is caused by a medicine that you are taking.  Tell a friend or a family member that you are feeling dizzy. If he or she notices any changes in your behavior, have this person call your health care provider.  Keep all follow-up visits as told by your health care provider. This is important. Contact a health care provider if:  Your dizziness does not go away.  Your dizziness or light-headedness gets worse.  You feel nauseous.  You have reduced hearing.  You have new symptoms.  You are unsteady on your feet or you feel like the room is spinning. Get help right away if:  You vomit or have diarrhea and are unable to eat or drink anything.  You have problems talking, walking, swallowing, or using your arms, hands, or legs.  You feel generally weak.  You are not thinking clearly or you have trouble forming sentences. It may take a friend or family member to notice this.  You have chest pain, abdominal pain, shortness of breath, or sweating.  Your vision changes.  You have any bleeding.  You have a severe headache.  You have neck pain or a stiff neck.  You have a fever. These symptoms may represent a serious problem that is an emergency. Do not wait to see if the symptoms will go away. Get medical help   right away. Call your local emergency services (911 in the [place] Do not drive yourself to the hospital. Summary  Dizziness is a feeling of unsteadiness or light-headedness. This condition can be caused by a number of things, including medicines, dehydration, or illness.  Anyone can become dizzy, but dizziness is more common in older adults.  Drink enough fluid to keep your urine clear or pale yellow. Do not drink alcohol.  Avoid making quick movements if you feel dizzy. Monitor your dizziness for any changes. This information is not intended to  replace advice given to you by your health care provider. Make sure you discuss any questions you have with your health care provider. Document Released: 10/29/2000 Document Revised: 06/07/2016 Document Reviewed: 06/07/2016 Elsevier Interactive Patient Education  2019 [employer]   Citalopram tablets What is this medicine? CITALOPRAM (sye TAL oh pram) is a medicine for depression. This medicine may be used for other purposes; ask your health care provider or pharmacist if you have questions. COMMON BRAND NAME(S): Celexa What should I tell my health care provider before I take this medicine? They need to know if you have any of these conditions: -bleeding disorders -bipolar disorder or a family history of bipolar disorder -glaucoma -heart disease -history of irregular heartbeat -kidney disease -liver disease -low levels of magnesium or potassium in the blood -receiving electroconvulsive therapy -seizures -suicidal thoughts, plans, or attempt; a previous suicide attempt by you or a family member -take medicines that treat or prevent blood clots -thyroid disease -an unusual or allergic reaction to citalopram, escitalopram, other medicines, foods, dyes, or preservatives -pregnant or trying to become pregnant -breast-feeding How should I use this medicine? Take this medicine by mouth with a glass of water. Follow the directions on the prescription label. You can take it with or without food. Take your medicine at regular intervals. Do not take your medicine more often than directed. Do not stop taking this medicine suddenly except upon the advice of your doctor. Stopping this medicine too quickly may cause serious side effects or your condition may worsen. A special MedGuide will be given to you by the pharmacist with each prescription and refill. Be sure to read this information carefully each time. Talk to your pediatrician regarding the use of this medicine in children. Special care  may be needed. Patients over 51 years old may have a stronger reaction and need a smaller dose. Overdosage: If you think you have taken too much of this medicine contact a poison control center or emergency room at once. NOTE: This medicine is only for you. Do not share this medicine with others. What if I miss a dose? If you miss a dose, take it as soon as you can. If it is almost time for your next dose, take only that dose. Do not take double or extra doses. What may interact with this medicine? Do not take this medicine with any of the following medications: -certain medicines for fungal infections like fluconazole, itraconazole, ketoconazole, posaconazole, voriconazole -cisapride -dofetilide -dronedarone -escitalopram -linezolid -MAOIs like Carbex, Eldepryl, Marplan, Nardil, and Parnate -methylene blue (injected into a vein) -pimozide -thioridazine -ziprasidone This medicine may also interact with the following medications: -alcohol -amphetamines -aspirin and aspirin-like medicines -carbamazepine -certain medicines for depression, anxiety, or psychotic disturbances -certain medicines for infections like chloroquine, clarithromycin, erythromycin, furazolidone, isoniazid, pentamidine -certain medicines for migraine headaches like almotriptan, eletriptan, frovatriptan, naratriptan, rizatriptan, sumatriptan, zolmitriptan -certain medicines for sleep -certain medicines that treat or prevent blood clots like dalteparin, enoxaparin,  warfarin -cimetidine -diuretics -fentanyl -lithium -methadone -metoprolol -NSAIDs, medicines for pain and inflammation, like ibuprofen or naproxen -omeprazole -other medicines that prolong the QT interval (cause an abnormal heart rhythm) -procarbazine -rasagiline -supplements like St. John's wort, kava kava, valerian -tramadol -tryptophan This list may not describe all possible interactions. Give your health care provider a list of all the  medicines, herbs, non-prescription drugs, or dietary supplements you use. Also tell them if you smoke, drink alcohol, or use illegal drugs. Some items may interact with your medicine. What should I watch for while using this medicine? Tell your doctor if your symptoms do not get better or if they get worse. Visit your doctor or health care professional for regular checks on your progress. Because it may take several weeks to see the full effects of this medicine, it is important to continue your treatment as prescribed by your doctor. Patients and their families should watch out for new or worsening thoughts of suicide or depression. Also watch out for sudden changes in feelings such as feeling anxious, agitated, panicky, irritable, hostile, aggressive, impulsive, severely restless, overly excited and hyperactive, or not being able to sleep. If this happens, especially at the beginning of treatment or after a change in dose, call your health care professional. Bonita QuinYou may get drowsy or dizzy. Do not drive, use machinery, or do anything that needs mental alertness until you know how this medicine affects you. Do not stand or sit up quickly, especially if you are an older patient. This reduces the risk of dizzy or fainting spells. Alcohol may interfere with the effect of this medicine. Avoid alcoholic drinks. Your mouth may get dry. Chewing sugarless gum or sucking hard candy, and drinking plenty of water will help. Contact your doctor if the problem does not go away or is severe. What side effects may I notice from receiving this medicine? Side effects that you should report to your doctor or health care professional as soon as possible: -allergic reactions like skin rash, itching or hives, swelling of the face, lips, or tongue -anxious -black, tarry stools -breathing problems -changes in vision -chest pain -confusion -elevated mood, decreased need for sleep, racing thoughts, impulsive behavior -eye  pain -fast, irregular heartbeat -feeling faint or lightheaded, falls -feeling agitated, angry, or irritable -hallucination, loss of contact with reality -loss of balance or coordination -loss of memory -painful or prolonged erections -restlessness, pacing, inability to keep still -seizures -stiff muscles -suicidal thoughts or other mood changes -trouble sleeping -unusual bleeding or bruising -unusually weak or tired -vomiting Side effects that usually do not require medical attention (report to your doctor or health care professional if they continue or are bothersome): -change in appetite or weight -change in sex drive or performance -dizziness -headache -increased sweating -indigestion, nausea -tremors This list may not describe all possible side effects. Call your doctor for medical advice about side effects. You may report side effects to FDA at 1-800-FDA-1088. Where should I keep my medicine? Keep out of reach of children. Store at room temperature between 15 and 30 degrees C (59 and 86 degrees F). Throw away any unused medicine after the expiration date. NOTE: This sheet is a summary. It may not cover all possible information. If you have questions about this medicine, talk to your doctor, pharmacist, or health care provider.  2019 Elsevier/Gold Standard (2015-10-08 13:18:52)   Meclizine tablets or capsules What is this medicine? MECLIZINE (MEK li zeen) is an antihistamine. It is used to prevent nausea, vomiting, or dizziness  caused by motion sickness. It is also used to prevent and treat vertigo (extreme dizziness or a feeling that you or your surroundings are tilting or spinning around). This medicine may be used for other purposes; ask your health care provider or pharmacist if you have questions. COMMON BRAND NAME(S): Antivert, Dramamine Less Drowsy, Dramamine-N, Medivert, Meni-D What should I tell my health care provider before I take this medicine? They need to know  if you have any of these conditions: -glaucoma -lung or breathing disease, like asthma -problems urinating -prostate disease -stomach or intestine problems -an unusual or allergic reaction to meclizine, other medicines, foods, dyes, or preservatives -pregnant or trying to get pregnant -breast-feeding How should I use this medicine? Take this medicine by mouth with a glass of water. Follow the directions on the prescription label. If you are using this medicine to prevent motion sickness, take the dose at least 1 hour before travel. If it upsets your stomach, take it with food or milk. Take your doses at regular intervals. Do not take your medicine more often than directed. Talk to your pediatrician regarding the use of this medicine in children. Special care may be needed. Overdosage: If you think you have taken too much of this medicine contact a poison control center or emergency room at once. NOTE: This medicine is only for you. Do not share this medicine with others. What if I miss a dose? If you miss a dose, take it as soon as you can. If it is almost time for your next dose, take only that dose. Do not take double or extra doses. What may interact with this medicine? Do not take this medicine with any of the following medications: -MAOIs like Carbex, Eldepryl, Marplan, Nardil, and Parnate This medicine may also interact with the following medications: -alcohol -antihistamines for allergy, cough and cold -certain medicines for anxiety or sleep -certain medicines for depression, like amitriptyline, fluoxetine, sertraline -certain medicines for seizures like phenobarbital, primidone -general anesthetics like halothane, isoflurane, methoxyflurane, propofol -local anesthetics like lidocaine, pramoxine, tetracaine -medicines that relax muscles for surgery -narcotic medicines for pain -phenothiazines like chlorpromazine, mesoridazine, prochlorperazine, thioridazine This list may not  describe all possible interactions. Give your health care provider a list of all the medicines, herbs, non-prescription drugs, or dietary supplements you use. Also tell them if you smoke, drink alcohol, or use illegal drugs. Some items may interact with your medicine. What should I watch for while using this medicine? Tell your doctor or healthcare professional if your symptoms do not start to get better or if they get worse. You may get drowsy or dizzy. Do not drive, use machinery, or do anything that needs mental alertness until you know how this medicine affects you. Do not stand or sit up quickly, especially if you are an older patient. This reduces the risk of dizzy or fainting spells. Alcohol may interfere with the effect of this medicine. Avoid alcoholic drinks. Your mouth may get dry. Chewing sugarless gum or sucking hard candy, and drinking plenty of water may help. Contact your doctor if the problem does not go away or is severe. This medicine may cause dry eyes and blurred vision. If you wear contact lenses you may feel some discomfort. Lubricating drops may help. See your eye doctor if the problem does not go away or is severe. What side effects may I notice from receiving this medicine? Side effects that you should report to your doctor or health care professional as soon as possible: -  feeling faint or lightheaded, falls -fast, irregular heartbeat Side effects that usually do not require medical attention (report to your doctor or health care professional if they continue or are bothersome): -constipation -headache -trouble passing urine or change in the amount of urine -trouble sleeping -upset stomach This list may not describe all possible side effects. Call your doctor for medical advice about side effects. You may report side effects to FDA at 1-800-FDA-1088. Where should I keep my medicine? Keep out of the reach of children. Store at room temperature between 15 and 30 degrees C  (59 and 86 degrees F). Keep container tightly closed. Throw away any unused medicine after the expiration date. NOTE: This sheet is a summary. It may not cover all possible information. If you have questions about this medicine, talk to your doctor, pharmacist, or health care provider.  2019 Elsevier/Gold Standard (2015-06-06 19:41:02)    HYPERTENSION INFORMATION  Monitor your blood pressure at home, please keep a record and bring that in with you to your next office visit.   Go to the ER if any CP, SOB, nausea, dizziness, severe HA, changes vision/speech   Your most recent BP: BP: 130/80   Maintain a healthy weight. Get at least 150 minutes of aerobic exercise per week. Minimize salt intake. Minimize alcohol intake  DASH Eating Plan DASH stands for "Dietary Approaches to Stop Hypertension." The DASH eating plan is a healthy eating plan that has been shown to reduce high blood pressure (hypertension). Additional health benefits may include reducing the risk of type 2 diabetes mellitus, heart disease, and stroke. The DASH eating plan may also help with weight loss. WHAT DO I NEED TO KNOW ABOUT THE DASH EATING PLAN? For the DASH eating plan, you will follow these general guidelines:  Choose foods with a percent daily value for sodium of less than 5% (as listed on the food label).  Use salt-free seasonings or herbs instead of table salt or sea salt.  Check with your health care provider or pharmacist before using salt substitutes.  Eat lower-sodium products, often labeled as "lower sodium" or "no salt added."  Eat fresh foods.  Eat more vegetables, fruits, and low-fat dairy products.  Choose whole grains. Look for the word "whole" as the first word in the ingredient list.  Choose fish and skinless chicken or [place] more often than red meat. Limit fish, poultry, and meat to 6 oz (170 g) each day.  Limit sweets, desserts, sugars, and sugary drinks.  Choose heart-healthy  fats.  Limit cheese to 1 oz (28 g) per day.  Eat more home-cooked food and less restaurant, buffet, and fast food.  Limit fried foods.  Cook foods using methods other than frying.  Limit canned vegetables. If you do use them, rinse them well to decrease the sodium.  When eating at a restaurant, ask that your food be prepared with less salt, or no salt if possible. WHAT FOODS CAN I EAT? Seek help from a dietitian for individual calorie needs. Grains Whole grain or whole wheat bread. Brown rice. Whole grain or whole wheat pasta. Quinoa, bulgur, and whole grain cereals. Low-sodium cereals. Corn or whole wheat flour tortillas. Whole grain cornbread. Whole grain crackers. Low-sodium crackers. Vegetables Fresh or frozen vegetables (raw, steamed, roasted, or grilled). Low-sodium or reduced-sodium tomato and vegetable juices. Low-sodium or reduced-sodium tomato sauce and paste. Low-sodium or reduced-sodium canned vegetables.  Fruits All fresh, canned (in natural juice), or frozen fruits. Meat and Other Protein Products Ground beef (85%  or leaner), grass-fed beef, or beef trimmed of fat. Skinless chicken or [place]. Ground chicken or [place]. Pork trimmed of fat. All fish and seafood. Eggs. Dried beans, peas, or lentils. Unsalted nuts and seeds. Unsalted canned beans. Dairy Low-fat dairy products, such as skim or 1% milk, 2% or reduced-fat cheeses, low-fat ricotta or cottage cheese, or plain low-fat yogurt. Low-sodium or reduced-sodium cheeses. Fats and Oils Tub margarines without trans fats. Light or reduced-fat mayonnaise and salad dressings (reduced sodium). Avocado. Safflower, olive, or canola oils. Natural peanut or almond butter. Other Unsalted popcorn and pretzels. The items listed above may not be a complete list of recommended foods or beverages. Contact your dietitian for more options. WHAT FOODS ARE NOT RECOMMENDED? Grains White bread. White pasta. White rice. Refined cornbread.  Bagels and croissants. Crackers that contain trans fat. Vegetables Creamed or fried vegetables. Vegetables in a cheese sauce. Regular canned vegetables. Regular canned tomato sauce and paste. Regular tomato and vegetable juices. Fruits Dried fruits. Canned fruit in light or heavy syrup. Fruit juice. Meat and Other Protein Products Fatty cuts of meat. Ribs, chicken wings, bacon, sausage, bologna, salami, chitterlings, fatback, hot dogs, bratwurst, and packaged luncheon meats. Salted nuts and seeds. Canned beans with salt. Dairy Whole or 2% milk, cream, half-and-half, and cream cheese. Whole-fat or sweetened yogurt. Full-fat cheeses or blue cheese. Nondairy creamers and whipped toppings. Processed cheese, cheese spreads, or cheese curds. Condiments Onion and garlic salt, seasoned salt, table salt, and sea salt. Canned and packaged gravies. Worcestershire sauce. Tartar sauce. Barbecue sauce. Teriyaki sauce. Soy sauce, including reduced sodium. Steak sauce. Fish sauce. Oyster sauce. Cocktail sauce. Horseradish. Ketchup and mustard. Meat flavorings and tenderizers. Bouillon cubes. Hot sauce. Tabasco sauce. Marinades. Taco seasonings. Relishes. Fats and Oils Butter, stick margarine, lard, shortening, ghee, and bacon fat. Coconut, palm kernel, or palm oils. Regular salad dressings. Other Pickles and olives. Salted popcorn and pretzels. The items listed above may not be a complete list of foods and beverages to avoid. Contact your dietitian for more information. WHERE CAN I FIND MORE INFORMATION? National Heart, Lung, and Blood Institute: [employer] Document Released: 04/24/2011 Document Revised: 09/19/2013 Document Reviewed: 03/09/2013 [facility] Patient Information 2015 [place], [place]. This information is not intended to replace advice given to you by your health care provider. Make sure you discuss any questions you have with your health care provider.

## 2018-09-08 DIAGNOSIS — Z6830 Body mass index (BMI) 30.0-30.9, adult: Secondary | ICD-10-CM | POA: Insufficient documentation

## 2018-09-08 NOTE — Progress Notes (Signed)
Assessment and Plan:   Matthew Wu was seen today for follow-up.  Diagnoses and all orders for this visit:  Hypertension, unspecified type Fairly controlled at this time Discussed DASH diet, lifestyle changes   Reminder to go to the ER if any CP, SOB, nausea, dizziness, severe HA, changes vision/speech, left arm numbness and tingling and jaw pain. -     CBC with Differential/Platelet -     COMPLETE METABOLIC PANEL WITH GFR -     Urinalysis, Routine w reflex microscopic -     TSH  Dizziness, nonspecific I suspect inner ear etiology or multifactoral  continue pushing fluids Suggested adding allergy medication, try very low sodium diet Continue pushing hydration Contact office with any new/different symptoms Overall reports is improving, neg head CT, monitor  -     CBC with Differential/Platelet -     COMPLETE METABOLIC PANEL WITH GFR -     Urinalysis, Routine w reflex microscopic  Fatigue Check labs, add vitamin D, B12 or b complex supplement Diet/lifestyle discussed -     CBC with Differential/Platelet -     COMPLETE METABOLIC PANEL WITH GFR -     Urinalysis, Routine w reflex microscopic -     TSH  Anxiety Resolved/improved Stress management techniques discussed, increase water, good sleep hygiene discussed, increase exercise, and increase veggies.   Overweight (BMI 25.0-29.9) Continue to recommend diet heavy in fruits and veggies and low in animal meats, cheeses, and dairy products, appropriate calorie intake Discuss exercise recommendations routinely Continue to monitor weight at each visit   Further disposition pending results of labs. Discussed med's effects and SE's.   Over 30 minutes of exam, counseling, chart review, and critical decision making was performed.   No future appointments.  ------------------------------------------------------------------------------------------------------------------   HPI BP 126/82   Pulse 67   Temp (!) 97.5 F (36.4 C)   Wt  209 lb (94.8 kg)   SpO2 97%   BMI 30.86 kg/m   23 y.o.male with hx of elevated BPs, reflux presents for 8 week follow up of episodes of dizziness, htn and anxiety; Patient is cash pay.   He reported feeling off balance, with some nausea; also having intermittent tinnitis bilaterally. He reported dizziness worst when he is falling asleep or if he wakes up suddenly during the night when he is supine. He reports this sometimes feels as though the room is spinning. He reports rarely having symptoms during the day, does endorse worst with abrupt changes in position. He has been taking meclizine PRN, this does help dizziness significantly, but reports residual "fatigue" - feels heavy all over, resolves quickly once he gets up and going.   He been seen in ED for palpitations, parasthesias, headaches with unremarkable workups including CT on 06/04/2018 and 01/30/2018, was provided with referral to neurology to follow up but patient has not done so due to lack of insurance. He reports these issues are significantly improved.   He had reported he was having anxiety, first child (girl) was born recently, 2 months ago. He reported commonly feeling anxious, will wake up with sensation of pounding heart (denies chest pain, racing heart, dyspnea). He was initiated on zoloft, but reports didn't feel himself on med and stopped after a few days. He feels anxiety is improved/resolved and managing well.   His has hx of mildly elevated BPs, today their BP is BP: 126/82  He does not workout. He denies chest pain, shortness of breath. Does have episodic dizziness. Does get somewhat short of  breath after climbing stairs.   Today he reports remote history of liver enzyme elevation that he never followed up on.   Past Medical History:  Diagnosis Date  . Asthma      Allergies  Allergen Reactions  . Robitussin (Alcohol Free) [Guaifenesin] Nausea And Vomiting    Current Outpatient Medications on File Prior to Visit   Medication Sig  . Calcium Carbonate Antacid (TUMS PO) Take by mouth as needed.  . meclizine (ANTIVERT) 25 MG tablet 1/2-1 pill up to 3 times daily for motion sickness/dizziness  . citalopram (CELEXA) 20 MG tablet Take 1 tablet (20 mg total) by mouth daily. (Patient not taking: Reported on 09/09/2018)   No current facility-administered medications on file prior to visit.     ROS: Review of Systems  Constitutional: Negative for malaise/fatigue and weight loss.  HENT: Positive for tinnitus. Negative for congestion, ear pain, hearing loss, sinus pain and sore throat.   Eyes: Negative for blurred vision and double vision.  Respiratory: Negative for cough, shortness of breath and wheezing.   Cardiovascular: Negative for chest pain, palpitations, orthopnea, claudication and leg swelling.  Gastrointestinal: Negative for abdominal pain, blood in stool, constipation, diarrhea, heartburn, melena, nausea and vomiting.  Genitourinary: Negative.   Musculoskeletal: Negative for falls, joint pain and myalgias.  Skin: Negative for rash.  Neurological: Positive for dizziness. Negative for tingling, sensory change, weakness and headaches.  Endo/Heme/Allergies: Positive for environmental allergies. Negative for polydipsia.  Psychiatric/Behavioral: Negative.  Negative for depression, memory loss and substance abuse. The patient is not nervous/anxious and does not have insomnia.   All other systems reviewed and are negative.    Physical Exam:  BP 126/82   Pulse 67   Temp (!) 97.5 F (36.4 C)   Wt 209 lb (94.8 kg)   SpO2 97%   BMI 30.86 kg/m   General Appearance: Well nourished, in no apparent distress. Eyes: PERRLA, EOMs, conjunctiva no swelling or erythema Sinuses: No Frontal/maxillary tenderness ENT/Mouth: Ext aud canals clear, TMs without erythema, bulging. No erythema, swelling, or exudate on post pharynx.  Tonsils not swollen or erythematous. Hearing normal.  Neck: Supple, thyroid normal.   Respiratory: Respiratory effort normal, BS equal bilaterally without rales, rhonchi, wheezing or stridor.  Cardio: RRR with no MRGs. Brisk peripheral pulses without edema.  Abdomen: Soft, + BS.  Non tender, no guarding, rebound, hernias, masses. Lymphatics: Non tender without lymphadenopathy.  Musculoskeletal: Full ROM, 5/5 strength, normal gait.  Skin: Warm, dry without rashes, lesions, ecchymosis.  Neuro: Cranial nerves intact. Normal muscle tone, no cerebellar symptoms. Sensation intact. Dizziness not elicited during exam.  Psych: Awake and oriented X 3, normal affect, Insight and Judgment appropriate.     Dan Maker, NP 11:17 AM [facility]

## 2018-09-09 ENCOUNTER — Encounter: Payer: Self-pay | Admitting: Adult Health

## 2018-09-09 ENCOUNTER — Ambulatory Visit (INDEPENDENT_AMBULATORY_CARE_PROVIDER_SITE_OTHER): Payer: Self-pay | Admitting: Adult Health

## 2018-09-09 ENCOUNTER — Other Ambulatory Visit: Payer: Self-pay

## 2018-09-09 VITALS — BP 126/82 | HR 67 | Temp 97.5°F | Wt 209.0 lb

## 2018-09-09 DIAGNOSIS — E559 Vitamin D deficiency, unspecified: Secondary | ICD-10-CM

## 2018-09-09 DIAGNOSIS — E785 Hyperlipidemia, unspecified: Secondary | ICD-10-CM

## 2018-09-09 DIAGNOSIS — R42 Dizziness and giddiness: Secondary | ICD-10-CM

## 2018-09-09 DIAGNOSIS — F419 Anxiety disorder, unspecified: Secondary | ICD-10-CM

## 2018-09-09 DIAGNOSIS — Z6830 Body mass index (BMI) 30.0-30.9, adult: Secondary | ICD-10-CM

## 2018-09-09 DIAGNOSIS — I10 Essential (primary) hypertension: Secondary | ICD-10-CM

## 2018-09-09 NOTE — Patient Instructions (Addendum)
Goals    . DIET - INCREASE WATER INTAKE     5-6 bottles daily     . Exercise 150 min/wk Moderate Activity      Continue multivitamin   Recommend getting on vitamin D supplement daily    Several things to try below - gradually introduce these changes - add every 2 weeks and watch to see if there's a significant improvement with any particular change  Try adding daily allergy medication (I like generic allegra - fexofenadine)  Then try very low sodium diet - avoid processed/canned foods - frozen is usually ok as long as its veggies without seasoning - for a few weeks and see if this makes a big difference in your dizziness episodes  Consider adding sublingual B12 supplment or b complex supplement     Your most recent BP: BP: 126/82   Maintain a healthy weight. Get at least 150 minutes of aerobic exercise per week. Minimize salt intake. Minimize alcohol intake  DASH Eating Plan DASH stands for "Dietary Approaches to Stop Hypertension." The DASH eating plan is a healthy eating plan that has been shown to reduce high blood pressure (hypertension). Additional health benefits may include reducing the risk of type 2 diabetes mellitus, heart disease, and stroke. The DASH eating plan may also help with weight loss. WHAT DO I NEED TO KNOW ABOUT THE DASH EATING PLAN? For the DASH eating plan, you will follow these general guidelines:  Choose foods with a percent daily value for sodium of less than 5% (as listed on the food label).  Use salt-free seasonings or herbs instead of table salt or sea salt.  Check with your health care provider or pharmacist before using salt substitutes.  Eat lower-sodium products, often labeled as "lower sodium" or "no salt added."  Eat fresh foods.  Eat more vegetables, fruits, and low-fat dairy products.  Choose whole grains. Look for the word "whole" as the first word in the ingredient list.  Choose fish and skinless chicken or [place] more often  than red meat. Limit fish, poultry, and meat to 6 oz (170 g) each day.  Limit sweets, desserts, sugars, and sugary drinks.  Choose heart-healthy fats.  Limit cheese to 1 oz (28 g) per day.  Eat more home-cooked food and less restaurant, buffet, and fast food.  Limit fried foods.  Cook foods using methods other than frying.  Limit canned vegetables. If you do use them, rinse them well to decrease the sodium.  When eating at a restaurant, ask that your food be prepared with less salt, or no salt if possible. WHAT FOODS CAN I EAT? Seek help from a dietitian for individual calorie needs. Grains Whole grain or whole wheat bread. Brown rice. Whole grain or whole wheat pasta. Quinoa, bulgur, and whole grain cereals. Low-sodium cereals. Corn or whole wheat flour tortillas. Whole grain cornbread. Whole grain crackers. Low-sodium crackers. Vegetables Fresh or frozen vegetables (raw, steamed, roasted, or grilled). Low-sodium or reduced-sodium tomato and vegetable juices. Low-sodium or reduced-sodium tomato sauce and paste. Low-sodium or reduced-sodium canned vegetables.  Fruits All fresh, canned (in natural juice), or frozen fruits. Meat and Other Protein Products Ground beef (85% or leaner), grass-fed beef, or beef trimmed of fat. Skinless chicken or [place]. Ground chicken or [place]. Pork trimmed of fat. All fish and seafood. Eggs. Dried beans, peas, or lentils. Unsalted nuts and seeds. Unsalted canned beans. Dairy Low-fat dairy products, such as skim or 1% milk, 2% or reduced-fat cheeses, low-fat ricotta or cottage  cheese, or plain low-fat yogurt. Low-sodium or reduced-sodium cheeses. Fats and Oils Tub margarines without trans fats. Light or reduced-fat mayonnaise and salad dressings (reduced sodium). Avocado. Safflower, olive, or canola oils. Natural peanut or almond butter. Other Unsalted popcorn and pretzels. The items listed above may not be a complete list of recommended foods or  beverages. Contact your dietitian for more options. WHAT FOODS ARE NOT RECOMMENDED? Grains White bread. White pasta. White rice. Refined cornbread. Bagels and croissants. Crackers that contain trans fat. Vegetables Creamed or fried vegetables. Vegetables in a cheese sauce. Regular canned vegetables. Regular canned tomato sauce and paste. Regular tomato and vegetable juices. Fruits Dried fruits. Canned fruit in light or heavy syrup. Fruit juice. Meat and Other Protein Products Fatty cuts of meat. Ribs, chicken wings, bacon, sausage, bologna, salami, chitterlings, fatback, hot dogs, bratwurst, and packaged luncheon meats. Salted nuts and seeds. Canned beans with salt. Dairy Whole or 2% milk, cream, half-and-half, and cream cheese. Whole-fat or sweetened yogurt. Full-fat cheeses or blue cheese. Nondairy creamers and whipped toppings. Processed cheese, cheese spreads, or cheese curds. Condiments Onion and garlic salt, seasoned salt, table salt, and sea salt. Canned and packaged gravies. Worcestershire sauce. Tartar sauce. Barbecue sauce. Teriyaki sauce. Soy sauce, including reduced sodium. Steak sauce. Fish sauce. Oyster sauce. Cocktail sauce. Horseradish. Ketchup and mustard. Meat flavorings and tenderizers. Bouillon cubes. Hot sauce. Tabasco sauce. Marinades. Taco seasonings. Relishes. Fats and Oils Butter, stick margarine, lard, shortening, ghee, and bacon fat. Coconut, palm kernel, or palm oils. Regular salad dressings. Other Pickles and olives. Salted popcorn and pretzels. The items listed above may not be a complete list of foods and beverages to avoid. Contact your dietitian for more information. WHERE CAN I FIND MORE INFORMATION? National Heart, [employer], and [employer] Institute: [employer] Document Released: 04/24/2011 Document Revised: 09/19/2013 Document Reviewed: 03/09/2013 [facility] Patient Information 2015 [place], [place]. This information is not  intended to replace advice given to you by your health care provider. Make sure you discuss any questions you have with your health care provider.

## 2018-09-10 LAB — URINALYSIS, ROUTINE W REFLEX MICROSCOPIC
Bilirubin Urine: NEGATIVE
Glucose, UA: NEGATIVE
Hgb urine dipstick: NEGATIVE
Ketones, ur: NEGATIVE
Leukocytes,Ua: NEGATIVE
Nitrite: NEGATIVE
Protein, ur: NEGATIVE
Specific Gravity, Urine: 1.015 (ref 1.001–1.03)
pH: 6.5 (ref 5.0–8.0)

## 2018-09-10 LAB — CBC WITH DIFFERENTIAL/PLATELET
Absolute Monocytes: 740 cells/uL (ref 200–950)
Basophils Absolute: 52 cells/uL (ref 0–200)
Basophils Relative: 0.7 %
Eosinophils Absolute: 237 cells/uL (ref 15–500)
Eosinophils Relative: 3.2 %
HCT: 45.5 % (ref 38.5–50.0)
Hemoglobin: 15.6 g/dL (ref 13.2–17.1)
Lymphs Abs: 2701 cells/uL (ref 850–3900)
MCH: 30.5 pg (ref 27.0–33.0)
MCHC: 34.3 g/dL (ref 32.0–36.0)
MCV: 88.9 fL (ref 80.0–100.0)
MPV: 10.1 fL (ref 7.5–12.5)
Monocytes Relative: 10 %
Neutro Abs: 3670 cells/uL (ref 1500–7800)
Neutrophils Relative %: 49.6 %
Platelets: 296 10*3/uL (ref 140–400)
RBC: 5.12 10*6/uL (ref 4.20–5.80)
RDW: 12.9 % (ref 11.0–15.0)
Total Lymphocyte: 36.5 %
WBC: 7.4 10*3/uL (ref 3.8–10.8)

## 2018-09-10 LAB — COMPLETE METABOLIC PANEL WITH GFR
AG Ratio: 1.3 (calc) (ref 1.0–2.5)
ALT: 46 U/L (ref 9–46)
AST: 20 U/L (ref 10–40)
Albumin: 4.5 g/dL (ref 3.6–5.1)
Alkaline phosphatase (APISO): 88 U/L (ref 36–130)
BUN: 14 mg/dL (ref 7–25)
CO2: 31 mmol/L (ref 20–32)
Calcium: 9.9 mg/dL (ref 8.6–10.3)
Chloride: 102 mmol/L (ref 98–110)
Creat: 0.97 mg/dL (ref 0.60–1.35)
GFR, Est African American: 126 mL/min/{1.73_m2} (ref >=60)
GFR, Est Non African American: 109 mL/min/{1.73_m2} (ref >=60)
Globulin: 3.4 g/dL (calc) (ref 1.9–3.7)
Glucose, Bld: 85 mg/dL (ref 65–99)
Potassium: 4.1 mmol/L (ref 3.5–5.3)
Sodium: 139 mmol/L (ref 135–146)
Total Bilirubin: 0.5 mg/dL (ref 0.2–1.2)
Total Protein: 7.9 g/dL (ref 6.1–8.1)

## 2018-09-10 LAB — TSH: TSH: 2.62 mIU/L (ref 0.40–4.50)

## 2018-09-27 ENCOUNTER — Telehealth: Payer: Self-pay

## 2018-09-27 NOTE — Telephone Encounter (Signed)
Patient calling about his BP. Still running high, wants to know if he should be seen or not. He was calling wanting to see what to do next.

## 2018-09-28 NOTE — Telephone Encounter (Signed)
Patient states that on Sunday it was 143/103 & 160/92. Monday was 151/84. States that he is feeling sleepy and tired and has a headache.

## 2018-09-29 NOTE — Telephone Encounter (Signed)
Patient is scheduled for a nurse visit for BP check

## 2018-09-30 ENCOUNTER — Ambulatory Visit: Payer: Self-pay

## 2019-07-21 NOTE — Progress Notes (Signed)
Assessment and Plan:  Matthew Wu was seen today for hypertension.  Diagnoses and all orders for this visit:  Hypertension, unspecified type Start amlodipine 5 mg daily; increase to 10 mg if persists above goal in 10-14 days Follow up in 3-4 weeks with home BP values Monitor blood pressure at home; call if consistently over 130/80 Continue DASH diet.   Reminder to go to the ER if any CP, SOB, nausea, dizziness, severe HA, changes vision/speech, left arm numbness and tingling and jaw pain. Follow up with any SE; potential for edema discussed, will change med if this happens -     amLODipine (NORVASC) 10 MG tablet; Start taking 1/2 tab daily in the evening for blood pressure; increase to whole tab in 2 weeks if staying above 130/80.  Further disposition pending results of labs. Discussed med's effects and SE's.   Over 15 minutes of exam, counseling, chart review, and critical decision making was performed.   Future Appointments  Date Time Provider [place]  09/12/2019  3:00 PM Liane Comber, NP [facility] None    ------------------------------------------------------------------------------------------------------------------   HPI BP (!) 146/88   Pulse 75   Temp (!) 97.3 F (36.3 C)   Wt 215 lb 12.8 oz (97.9 kg)   SpO2 97%   BMI 31.87 kg/m   24 y.o.male with hx hypertension presents for evaluation due to noting elevated BPs at home.   Reports always checks intermittently at home, reports consistently in 140s/90s, has been as high as 160/90. Reports does have sense of headache and nausea with elevated values. Reports has had elevated BP in the past, believes he was on med as a child but unsure what. Mom and grandmother also have htn. He denies excess ETOH intake, occasional beer or two, drinks lots of water, does admit to a red bull on days he works, hasn't had today. Denies drug use.   Today their BP is BP: (!) 146/88  He does not workout. He denies chest pain, shortness  of breath, dizziness.  He does not currently have insurance, expecting to get later this year via partner's new job.  He had labs at last visit, normal CBC, CMP, TSH, UA in 08/2018.    Past Medical History:  Diagnosis Date  . Asthma      Allergies  Allergen Reactions  . Robitussin (Alcohol Free) [Guaifenesin] Nausea And Vomiting    Current Outpatient Medications on File Prior to Visit  Medication Sig  . Calcium Carbonate Antacid (TUMS PO) Take by mouth as needed.  . citalopram (CELEXA) 20 MG tablet Take 1 tablet (20 mg total) by mouth daily. (Patient not taking: Reported on 09/09/2018)  . meclizine (ANTIVERT) 25 MG tablet 1/2-1 pill up to 3 times daily for motion sickness/dizziness   No current facility-administered medications on file prior to visit.    ROS: all negative except above.   Physical Exam:  BP (!) 146/88   Pulse 75   Temp (!) 97.3 F (36.3 C)   Wt 215 lb 12.8 oz (97.9 kg)   SpO2 97%   BMI 31.87 kg/m   General Appearance: Well nourished, in no apparent distress. Eyes: PERRLA, EOMs, conjunctiva no swelling or erythema ENT/Mouth:  No erythema, swelling, or exudate on post pharynx.  Tonsils not swollen or erythematous. Hearing normal.  Neck: Supple, thyroid normal.  Respiratory: Respiratory effort normal, BS equal bilaterally without rales, rhonchi, wheezing or stridor.  Cardio: RRR with no MRGs. Brisk peripheral pulses without edema.  Abdomen: Soft, + BS.  Non  tender, no guarding, rebound, hernias, masses. Lymphatics: Non tender without lymphadenopathy.  Musculoskeletal: Full ROM, 5/5 strength, normal gait.  Skin: Warm, dry without rashes, lesions, ecchymosis.  Neuro: Cranial nerves intact. Normal muscle tone, no cerebellar symptoms. Sensation intact.  Psych: Awake and oriented X 3, normal affect, Insight and Judgment appropriate.     Dan Maker, NP 8:57 AM [facility]

## 2019-07-22 ENCOUNTER — Other Ambulatory Visit: Payer: Self-pay

## 2019-07-22 ENCOUNTER — Encounter: Payer: Self-pay | Admitting: Adult Health

## 2019-07-22 ENCOUNTER — Ambulatory Visit: Payer: Self-pay | Admitting: Adult Health

## 2019-07-22 VITALS — BP 146/88 | HR 75 | Temp 97.3°F | Wt 215.8 lb

## 2019-07-22 DIAGNOSIS — I10 Essential (primary) hypertension: Secondary | ICD-10-CM

## 2019-07-22 MED ORDER — AMLODIPINE BESYLATE 10 MG PO TABS: ORAL_TABLET | ORAL | 1 refills | Status: DC

## 2019-07-22 NOTE — Patient Instructions (Addendum)
Goals    . Blood Pressure < 130/80    . DIET - INCREASE WATER INTAKE     5-6 bottles daily     . Exercise 150 min/wk Moderate Activity       HYPERTENSION INFORMATION  Monitor your blood pressure at home, please keep a record and bring that in with you to your next office visit.   Go to the ER if any CP, SOB, nausea, dizziness, severe HA, changes vision/speech  Testing/Procedures: HOW TO TAKE YOUR BLOOD PRESSURE:  Rest 5 minutes before taking your blood pressure.  Don't smoke or drink caffeinated beverages for at least 30 minutes before.  Take your blood pressure before (not after) you eat.  Sit comfortably with your back supported and both feet on the floor (don't cross your legs).  Elevate your arm to heart level on a table or a desk.  Use the proper sized cuff. It should fit smoothly and snugly around your bare upper arm. There should be enough room to slip a fingertip under the cuff. The bottom edge of the cuff should be 1 inch above the crease of the elbow.  Your most recent BP: BP: (!) 146/88   Take your medications faithfully as instructed. Maintain a healthy weight. Get at least 150 minutes of aerobic exercise per week. Minimize salt intake. Minimize alcohol intake  DASH Eating Plan DASH stands for "Dietary Approaches to Stop Hypertension." The DASH eating plan is a healthy eating plan that has been shown to reduce high blood pressure (hypertension). Additional health benefits may include reducing the risk of type 2 diabetes mellitus, heart disease, and stroke. The DASH eating plan may also help with weight loss. WHAT DO I NEED TO KNOW ABOUT THE DASH EATING PLAN? For the DASH eating plan, you will follow these general guidelines:  Choose foods with a percent daily value for sodium of less than 5% (as listed on the food label).  Use salt-free seasonings or herbs instead of table salt or sea salt.  Check with your health care provider or pharmacist before using  salt substitutes.  Eat lower-sodium products, often labeled as "lower sodium" or "no salt added."  Eat fresh foods.  Eat more vegetables, fruits, and low-fat dairy products.  Choose whole grains. Look for the word "whole" as the first word in the ingredient list.  Choose fish and skinless chicken or [place] more often than red meat. Limit fish, poultry, and meat to 6 oz (170 g) each day.  Limit sweets, desserts, sugars, and sugary drinks.  Choose heart-healthy fats.  Limit cheese to 1 oz (28 g) per day.  Eat more home-cooked food and less restaurant, buffet, and fast food.  Limit fried foods.  Cook foods using methods other than frying.  Limit canned vegetables. If you do use them, rinse them well to decrease the sodium.  When eating at a restaurant, ask that your food be prepared with less salt, or no salt if possible. WHAT FOODS CAN I EAT? Seek help from a dietitian for individual calorie needs. Grains Whole grain or whole wheat bread. Brown rice. Whole grain or whole wheat pasta. Quinoa, bulgur, and whole grain cereals. Low-sodium cereals. Corn or whole wheat flour tortillas. Whole grain cornbread. Whole grain crackers. Low-sodium crackers. Vegetables Fresh or frozen vegetables (raw, steamed, roasted, or grilled). Low-sodium or reduced-sodium tomato and vegetable juices. Low-sodium or reduced-sodium tomato sauce and paste. Low-sodium or reduced-sodium canned vegetables.  Fruits All fresh, canned (in natural juice), or frozen fruits.  Meat and Other Protein Products Ground beef (85% or leaner), grass-fed beef, or beef trimmed of fat. Skinless chicken or [place]. Ground chicken or [place]. Pork trimmed of fat. All fish and seafood. Eggs. Dried beans, peas, or lentils. Unsalted nuts and seeds. Unsalted canned beans. Dairy Low-fat dairy products, such as skim or 1% milk, 2% or reduced-fat cheeses, low-fat ricotta or cottage cheese, or plain low-fat yogurt. Low-sodium or  reduced-sodium cheeses. Fats and Oils Tub margarines without trans fats. Light or reduced-fat mayonnaise and salad dressings (reduced sodium). Avocado. Safflower, olive, or canola oils. Natural peanut or almond butter. Other Unsalted popcorn and pretzels. The items listed above may not be a complete list of recommended foods or beverages. Contact your dietitian for more options. WHAT FOODS ARE NOT RECOMMENDED? Grains White bread. White pasta. White rice. Refined cornbread. Bagels and croissants. Crackers that contain trans fat. Vegetables Creamed or fried vegetables. Vegetables in a cheese sauce. Regular canned vegetables. Regular canned tomato sauce and paste. Regular tomato and vegetable juices. Fruits Dried fruits. Canned fruit in light or heavy syrup. Fruit juice. Meat and Other Protein Products Fatty cuts of meat. Ribs, chicken wings, bacon, sausage, bologna, salami, chitterlings, fatback, hot dogs, bratwurst, and packaged luncheon meats. Salted nuts and seeds. Canned beans with salt. Dairy Whole or 2% milk, cream, half-and-half, and cream cheese. Whole-fat or sweetened yogurt. Full-fat cheeses or blue cheese. Nondairy creamers and whipped toppings. Processed cheese, cheese spreads, or cheese curds. Condiments Onion and garlic salt, seasoned salt, table salt, and sea salt. Canned and packaged gravies. Worcestershire sauce. Tartar sauce. Barbecue sauce. Teriyaki sauce. Soy sauce, including reduced sodium. Steak sauce. Fish sauce. Oyster sauce. Cocktail sauce. Horseradish. Ketchup and mustard. Meat flavorings and tenderizers. Bouillon cubes. Hot sauce. Tabasco sauce. Marinades. Taco seasonings. Relishes. Fats and Oils Butter, stick margarine, lard, shortening, ghee, and bacon fat. Coconut, palm kernel, or palm oils. Regular salad dressings. Other Pickles and olives. Salted popcorn and pretzels. The items listed above may not be a complete list of foods and beverages to avoid. Contact your  dietitian for more information. WHERE CAN I FIND MORE INFORMATION? National Heart, Lung, and Blood Institute: [employer] Document Released: 04/24/2011 Document Revised: 09/19/2013 Document Reviewed: 03/09/2013 [facility] Patient Information 2015 [place], [place]. This information is not intended to replace advice given to you by your health care provider. Make sure you discuss any questions you have with your health care provider.    Amlodipine Oral Tablets What is this medicine? AMLODIPINE (am LOE di peen) is a calcium channel blocker. It relaxes your blood vessels and decreases the amount of work the heart has to do. It treats high blood pressure and/or prevents chest pain (also called angina). This medicine may be used for other purposes; ask your health care provider or pharmacist if you have questions. COMMON BRAND NAME(S): Norvasc What should I tell my health care provider before I take this medicine? They need to know if you have any of these conditions:  heart disease  liver disease  an unusual or allergic reaction to amlodipine, other drugs, foods, dyes, or preservatives  pregnant or trying to get pregnant  breast-feeding How should I use this medicine? Take this drug by mouth. Take it as directed on the prescription label at the same time every day. You can take it with or without food. If it upsets your stomach, take it with food. Keep taking it unless your health care provider tells you to stop. Talk to your health care provider about the  use of this drug in children. While it may be prescribed for children as young as 6 for selected conditions, precautions do apply. Overdosage: If you think you have taken too much of this medicine contact a poison control center or emergency room at once. NOTE: This medicine is only for you. Do not share this medicine with others. What if I miss a dose? If you miss a dose, take it as soon as you can.  If it is almost time for your next dose, take only that dose. Do not take double or extra doses. What may interact with this medicine? This medicine may interact with the following medications:  clarithromycin  cyclosporine  diltiazem  itraconazole  simvastatin  tacrolimus This list may not describe all possible interactions. Give your health care provider a list of all the medicines, herbs, non-prescription drugs, or dietary supplements you use. Also tell them if you smoke, drink alcohol, or use illegal drugs. Some items may interact with your medicine. What should I watch for while using this medicine? Visit your health care provider for regular checks on your progress. Check your blood pressure as directed. Ask your health care provider what your blood pressure should be. Also, find out when you should contact him or her. Do not treat yourself for coughs, colds, or pain while you are using this drug without asking your health care provider for advice. Some drugs may increase your blood pressure. You may get drowsy or dizzy. Do not drive, use machinery, or do anything that needs mental alertness until you know how this drug affects you. Do not stand up or sit up quickly, especially if you are an older patient. This reduces the risk of dizzy or fainting spells. What side effects may I notice from receiving this medicine? Side effects that you should report to your doctor or health care provider as soon as possible:  allergic reactions (skin rash, itching or hives; swelling of the face, lips, or tongue)  heart attack (trouble breathing; pain or tightness in the chest, neck, back or arms; unusually weak or tired)  low blood pressure (dizziness; feeling faint or lightheaded, falls; unusually weak or tired) Side effects that usually do not require medical attention (report these to your doctor or health care provider if they continue or are bothersome):  facial  flushing  nausea  palpitations  stomach pain  sudden weight gain  swelling of the ankles, feet, hands This list may not describe all possible side effects. Call your doctor for medical advice about side effects. You may report side effects to FDA at 1-800-FDA-1088. Where should I keep my medicine? Keep out of the reach of children and pets. Store at room temperature between 59 and 86 degrees F (15 and 30 degrees C). Protect from light and moisture. Keep the container tightly closed. Throw away any unused drug after the expiration date. NOTE: This sheet is a summary. It may not cover all possible information. If you have questions about this medicine, talk to your doctor, pharmacist, or health care provider.  2020 Elsevier/Gold Standard (2019-02-08 19:39:45)

## 2019-09-12 ENCOUNTER — Encounter: Payer: Self-pay | Admitting: Adult Health

## 2020-05-04 NOTE — Progress Notes (Signed)
Assessment and Plan:  Hoyt was seen today for gi problem.  Diagnoses and all orders for this visit:  Gastroesophageal reflux disease without esophagitis Initiated medication for new reflux related symptoms: omeprazole 20 mg BID Discussed diet, avoiding triggers and other lifestyle changes -     omeprazole (PRILOSEC) 20 MG capsule; Take 1 cap 30 min prior to breakfast and prior to bedtime for 8 weeks, then try reducing to once daily.  Hypertension, unspecified type Restart amlodipine, titrate up to 10 mg daily for goal - follow up 4-6 weeks Monitor blood pressure at home; call if consistently over 130/80 Continue DASH diet.   Reminder to go to the ER if any CP, SOB, nausea, dizziness, severe HA, changes vision/speech, left arm numbness and tingling and jaw pain. -     amLODipine (NORVASC) 10 MG tablet; Taking 1 tab daily in the evening for blood pressure goal of <130/80. -     COMPLETE METABOLIC PANEL WITH GFR -     TSH -     Urinalysis, Routine w reflex microscopic  Medication management -     COMPLETE METABOLIC PANEL WITH GFR -     TSH -     Urinalysis, Routine w reflex microscopic  Obesity (BMI 30.0-34.9) Long discussion about weight loss, diet, and exercise Recommended diet heavy in fruits and veggies and low in animal meats, cheeses, and dairy products, appropriate calorie intake Discussed appropriate weight for height  Follow up at next visit   Further disposition pending results of labs. Discussed med's effects and SE's.   Over 30 minutes of exam, counseling, chart review, and critical decision making was performed.   Future Appointments  Date Time Provider Department Center  09/11/2020  3:00 PM Judd Gaudier, NP [facility] None    ------------------------------------------------------------------------------------------------------------------   HPI BP 138/90   Pulse 97   Temp 97.7 F (36.5 C)   Wt 213 lb (96.6 kg)   SpO2 98%   BMI 31.45 kg/m   24  y.o.male presents for evalauation of reflux sx and follow up on BP.   He has hx of reflux last 2 years, onset after had strong alcohol, was improved with cutting out alcohol but has been getting worse in the last year, in the last month having reflux sx with water (admits onset after having a few beers), has been managing with tums (3-4 days a week), mylanta (PRN). Report did do 14 days of nexium at one point, unsure if this helped.   Has been avoiding alcohol, minimal fried, minimal spicy food, rare coffee/soda. Denies NSAIDs. Also endorses belching, bloating, gassiness with upset stomach, improved in last 2 weeks but not resolving. Denies coughing, hoarseness. Reports sx worse during the day, doesn't wake up with reflux until drinks water.   His blood pressure has not been controlled at home (130/70s, tends to be elevated with abdominal pain), today their BP is BP: 138/90, has been taking amlodipine 1/2 tab only but ran out 3 weeks ago He denies chest pain, shortness of breath, dizziness.  BMI is Body mass index is 31.45 kg/m., he has been working on diet and exercise. Wt Readings from Last 3 Encounters:  05/07/20 213 lb (96.6 kg)  07/22/19 215 lb 12.8 oz (97.9 kg)  09/09/18 209 lb (94.8 kg)       Past Medical History:  Diagnosis Date  . Asthma      Allergies  Allergen Reactions  . Robitussin (Alcohol Free) [Guaifenesin] Nausea And Vomiting    Current Outpatient  Medications on File Prior to Visit  Medication Sig  . Calcium Carbonate Antacid (TUMS PO) Take by mouth as needed.   No current facility-administered medications on file prior to visit.    ROS: all negative except above.   Physical Exam:  BP 138/90   Pulse 97   Temp 97.7 F (36.5 C)   Wt 213 lb (96.6 kg)   SpO2 98%   BMI 31.45 kg/m   General Appearance: Well nourished, in no apparent distress. Eyes: PERRLA, EOMs, conjunctiva no swelling or erythema Sinuses: No Frontal/maxillary tenderness ENT/Mouth: Ext  aud canals clear, TMs without erythema, bulging. No erythema, swelling, or exudate on post pharynx.  Tonsils not swollen or erythematous. Hearing normal.  Neck: Supple, thyroid normal.  Respiratory: Respiratory effort normal, BS equal bilaterally without rales, rhonchi, wheezing or stridor.  Cardio: RRR with no MRGs. Brisk peripheral pulses without edema.  Abdomen: Soft, obese abdomen + BS.  Non tender, no guarding, rebound, hernias, masses. Lymphatics: Non tender without lymphadenopathy.  Musculoskeletal: Full ROM, 5/5 strength, normal gait.  Skin: Warm, dry without rashes, lesions, ecchymosis.  Neuro: Cranial nerves intact. Normal muscle tone, no cerebellar symptoms. Sensation intact.  Psych: Awake and oriented X 3, normal affect, Insight and Judgment appropriate.     Dan Maker, NP 5:26 PM [facility]

## 2020-05-07 ENCOUNTER — Encounter: Payer: Self-pay | Admitting: Adult Health

## 2020-05-07 ENCOUNTER — Ambulatory Visit (INDEPENDENT_AMBULATORY_CARE_PROVIDER_SITE_OTHER): Payer: Self-pay | Admitting: Adult Health

## 2020-05-07 ENCOUNTER — Other Ambulatory Visit: Payer: Self-pay

## 2020-05-07 VITALS — BP 138/90 | HR 97 | Temp 97.7°F | Wt 213.0 lb

## 2020-05-07 DIAGNOSIS — Z79899 Other long term (current) drug therapy: Secondary | ICD-10-CM

## 2020-05-07 DIAGNOSIS — E669 Obesity, unspecified: Secondary | ICD-10-CM | POA: Insufficient documentation

## 2020-05-07 DIAGNOSIS — I10 Essential (primary) hypertension: Secondary | ICD-10-CM

## 2020-05-07 DIAGNOSIS — K219 Gastro-esophageal reflux disease without esophagitis: Secondary | ICD-10-CM

## 2020-05-07 MED ORDER — AMLODIPINE BESYLATE 10 MG PO TABS: ORAL_TABLET | ORAL | 1 refills | Status: DC

## 2020-05-07 MED ORDER — OMEPRAZOLE 20 MG PO CPDR: DELAYED_RELEASE_CAPSULE | ORAL | 0 refills | Status: DC

## 2020-05-07 NOTE — Patient Instructions (Addendum)
Goals    . Blood Pressure < 130/80    . Exercise 150 min/wk Moderate Activity    . Weight (lb) < 180 lb (81.6 kg)       Please start 1/2 tab amlodipine x 1 week then increase to whole tab  Please start omeprazole 20 mg twice daily for 4-8 weeks, then if improved, will reduce to once daily     Food Choices for Gastroesophageal Reflux Disease, Adult When you have gastroesophageal reflux disease (GERD), the foods you eat and your eating habits are very important. Choosing the right foods can help ease your discomfort. Think about working with a nutrition specialist (dietitian) to help you make good choices. What are tips for following this plan?  Meals  Choose healthy foods that are low in fat, such as fruits, vegetables, whole grains, low-fat dairy products, and lean meat, fish, and poultry.  Eat small meals often instead of 3 large meals a day. Eat your meals slowly, and in a place where you are relaxed. Avoid bending over or lying down until 2-3 hours after eating.  Avoid eating meals 2-3 hours before bed.  Avoid drinking a lot of liquid with meals.  Cook foods using methods other than frying. Bake, grill, or broil food instead.  Avoid or limit: ? Chocolate. ? Peppermint or spearmint. ? Alcohol. ? Pepper. ? Black and decaffeinated coffee. ? Black and decaffeinated tea. ? Bubbly (carbonated) soft drinks. ? Caffeinated energy drinks and soft drinks.  Limit high-fat foods such as: ? Fatty meat or fried foods. ? Whole milk, cream, butter, or ice cream. ? Nuts and nut butters. ? Pastries, donuts, and sweets made with butter or shortening.  Avoid foods that cause symptoms. These foods may be different for everyone. Common foods that cause symptoms include: ? Tomatoes. ? Oranges, lemons, and limes. ? Peppers. ? Spicy food. ? Onions and garlic. ? Vinegar. Lifestyle  Maintain a healthy weight. Ask your doctor what weight is healthy for you. If you need to lose weight,  work with your doctor to do so safely.  Exercise for at least 30 minutes for 5 or more days each week, or as told by your doctor.  Wear loose-fitting clothes.  Do not smoke. If you need help quitting, ask your doctor.  Sleep with the head of your bed higher than your feet. Use a wedge under the mattress or blocks under the bed frame to raise the head of the bed. Summary  When you have gastroesophageal reflux disease (GERD), food and lifestyle choices are very important in easing your symptoms.  Eat small meals often instead of 3 large meals a day. Eat your meals slowly, and in a place where you are relaxed.  Limit high-fat foods such as fatty meat or fried foods.  Avoid bending over or lying down until 2-3 hours after eating.  Avoid peppermint and spearmint, caffeine, alcohol, and chocolate. This information is not intended to replace advice given to you by your health care provider. Make sure you discuss any questions you have with your health care provider. Document Revised: 08/26/2018 Document Reviewed: 06/10/2016 Elsevier Patient Education  2020 [employer].

## 2020-05-08 ENCOUNTER — Other Ambulatory Visit: Payer: Self-pay

## 2020-05-08 DIAGNOSIS — K219 Gastro-esophageal reflux disease without esophagitis: Secondary | ICD-10-CM

## 2020-05-08 DIAGNOSIS — I10 Essential (primary) hypertension: Secondary | ICD-10-CM

## 2020-05-08 LAB — COMPLETE METABOLIC PANEL WITH GFR
AG Ratio: 1.7 (calc) (ref 1.0–2.5)
ALT: 32 U/L (ref 9–46)
AST: 15 U/L (ref 10–40)
Albumin: 4.5 g/dL (ref 3.6–5.1)
Alkaline phosphatase (APISO): 96 U/L (ref 36–130)
BUN: 15 mg/dL (ref 7–25)
CO2: 27 mmol/L (ref 20–32)
Calcium: 9.4 mg/dL (ref 8.6–10.3)
Chloride: 107 mmol/L (ref 98–110)
Creat: 0.94 mg/dL (ref 0.60–1.35)
GFR, Est African American: 130 mL/min/{1.73_m2} (ref >=60)
GFR, Est Non African American: 112 mL/min/{1.73_m2} (ref >=60)
Globulin: 2.7 g/dL (calc) (ref 1.9–3.7)
Glucose, Bld: 93 mg/dL (ref 65–99)
Potassium: 3.9 mmol/L (ref 3.5–5.3)
Sodium: 139 mmol/L (ref 135–146)
Total Bilirubin: 0.3 mg/dL (ref 0.2–1.2)
Total Protein: 7.2 g/dL (ref 6.1–8.1)

## 2020-05-08 LAB — URINALYSIS, ROUTINE W REFLEX MICROSCOPIC
Bilirubin Urine: NEGATIVE
Glucose, UA: NEGATIVE
Hgb urine dipstick: NEGATIVE
Ketones, ur: NEGATIVE
Leukocytes,Ua: NEGATIVE
Nitrite: NEGATIVE
Protein, ur: NEGATIVE
Specific Gravity, Urine: 1.039 — ABNORMAL HIGH (ref 1.001–1.03)
pH: 6 (ref 5.0–8.0)

## 2020-05-08 LAB — TSH: TSH: 1.37 mIU/L (ref 0.40–4.50)

## 2020-05-08 MED ORDER — AMLODIPINE BESYLATE 10 MG PO TABS: ORAL_TABLET | ORAL | 1 refills | Status: DC

## 2020-05-08 MED ORDER — OMEPRAZOLE 20 MG PO CPDR: DELAYED_RELEASE_CAPSULE | ORAL | 0 refills | Status: DC

## 2020-05-08 NOTE — Progress Notes (Signed)
Patient is aware of lab results. -e welch

## 2020-06-07 ENCOUNTER — Ambulatory Visit: Payer: Self-pay | Admitting: Adult Health

## 2020-06-11 ENCOUNTER — Encounter: Payer: Self-pay | Admitting: Adult Health

## 2020-06-11 ENCOUNTER — Ambulatory Visit: Payer: Self-pay | Admitting: Adult Health

## 2020-06-11 ENCOUNTER — Other Ambulatory Visit: Payer: Self-pay

## 2020-06-11 VITALS — BP 134/88 | HR 82 | Temp 97.7°F | Wt 216.0 lb

## 2020-06-11 DIAGNOSIS — K219 Gastro-esophageal reflux disease without esophagitis: Secondary | ICD-10-CM

## 2020-06-11 DIAGNOSIS — I10 Essential (primary) hypertension: Secondary | ICD-10-CM

## 2020-06-11 DIAGNOSIS — E669 Obesity, unspecified: Secondary | ICD-10-CM

## 2020-06-11 MED ORDER — HYDROCHLOROTHIAZIDE 12.5 MG PO TABS: ORAL_TABLET | ORAL | 0 refills | Status: DC

## 2020-06-11 NOTE — Patient Instructions (Signed)
Goals    . Blood Pressure < 130/80    . Exercise 150 min/wk Moderate Activity    . Weight (lb) < 180 lb (81.6 kg)       After 8 weeks on omprazole try to back down from twice daily to just in the morning (slow taper over a few weeks)     Hydrochlorothiazide Capsules or Tablets What is this medicine? HYDROCHLOROTHIAZIDE (hye droe klor oh THYE a zide) is a diuretic. It helps you make more urine and to lose salt and excess water from your body. It treats swelling from heart, kidney, or liver disease. It also treats high blood pressure. This medicine may be used for other purposes; ask your health care provider or pharmacist if you have questions. COMMON BRAND NAME(S): Esidrix, Ezide, HydroDIURIL, Microzide, Oretic, Zide What should I tell my health care provider before I take this medicine? They need to know if you have any of these conditions:  diabetes  gout  kidney disease  liver disease  lupus  pancreatitis  an unusual or allergic reaction to hydrochlorothiazide, sulfa drugs, other medicines, foods, dyes, or preservatives  pregnant or trying to get pregnant  breast-feeding How should I use this medicine? Take this medicine by mouth. Take it as directed on the prescription label at the same time every day. You can take it with or without food. If it upsets your stomach, take it with food. Keep taking it unless your health care provider tells you to stop. Talk to your health care provider about the use of this medicine in children. While it may be prescribed for children as young as newborns for selected conditions, precautions do apply. Overdosage: If you think you have taken too much of this medicine contact a poison control center or emergency room at once. NOTE: This medicine is only for you. Do not share this medicine with others. What if I miss a dose? If you miss a dose, take it as soon as you can. If it is almost time for your next dose, take only that dose. Do not  take double or extra doses. What may interact with this medicine?  cholestyramine  colestipol  digoxin  dofetilide  lithium  medicines for blood pressure  medicines for diabetes  medicines that relax muscles for surgery  other diuretics  steroid medicines like prednisone or cortisone This list may not describe all possible interactions. Give your health care provider a list of all the medicines, herbs, non-prescription drugs, or dietary supplements you use. Also tell them if you smoke, drink alcohol, or use illegal drugs. Some items may interact with your medicine. What should I watch for while using this medicine? Visit your health care provider for regular check ups. Check your blood pressure as directed. Ask your health care provider what your blood pressure should be. Also, find out when you should contact him or her. Do not treat yourself for coughs, colds, or pain while you are using this medicine without asking your health care provider for advice. Some medicines may increase your blood pressure. You may get drowsy or dizzy. Do not drive, use machinery, or do anything that needs mental alertness until you know how this medicine affects you. Do not stand or sit up quickly, especially if you are an older patient. This reduces the risk of dizzy or fainting spells. Alcohol can make you more drowsy and dizzy. Avoid alcoholic drinks. Talk to your health care professional about your risk of skin cancer. You may  be more at risk for skin cancer if you take this medicine. This medicine can make you more sensitive to the sun. Keep out of the sun. If you cannot avoid being in the sun, wear protective clothing and use sunscreen. Do not use sun lamps or tanning beds/booths. You may need to be on a special diet while taking this medicine. Ask your health care provider. Also, find out how many glasses of fluids you need to drink each day. Check with your health care provider if you get an attack  of severe diarrhea, nausea and vomiting, or if you sweat a lot. The loss of too much body fluid can make it dangerous for you to take this medicine. This medicine may increase blood sugar. Ask your healthcare provider if changes in diet or medicines are needed if you have diabetes. What side effects may I notice from receiving this medicine? Side effects that you should report to your doctor or health care professional as soon as possible:  allergic reactions (skin rash, itching or hives; swelling of the face, lips, or tongue)  gout (severe pain, redness, or swelling in joints like the big toe)  high blood sugar (increased hunger, thirst or urination; unusually weak or tired; blurry vision)  kidney injury (trouble passing urine or change in the amount of urine)  low blood pressure (dizziness; feeling faint or lightheaded, falls; unusually weak or tired)  low potassium levels (trouble breathing; chest pain; dizziness; fast, irregular heartbeat; feeling faint or lightheaded, falls; muscle cramps or pain)  sudden change in vision or eye pain Side effects that usually do not require medical attention (report to your doctor or health care professional if they continue or are bothersome):  change in sex drive or performance  dry mouth  headache  stomach upset This list may not describe all possible side effects. Call your doctor for medical advice about side effects. You may report side effects to FDA at 1-800-FDA-1088. Where should I keep my medicine? Keep out of the reach of children and pets. Store at room temperature between 20 and 25 degrees C (68 and 77 degrees F). Protect from light and moisture. Keep the container tightly closed. Do not freeze. Get rid of any unused medicine after the expiration date. To get rid of medicines that are no longer needed or have expired:  Take the medicine to a medicine take-back program. Check with your pharmacy or law enforcement to find a  location.  If you cannot return the medicine, check the label or package insert to see if the medicine should be thrown out in the garbage or flushed down the toilet. If you are not sure, ask your health care provider. If it is safe to put in the trash, empty the medicine out of the container. Mix the medicine with cat litter, dirt, coffee grounds, or other unwanted substance. Seal the mixture in a bag or container. Put it in the trash. NOTE: This sheet is a summary. It may not cover all possible information. If you have questions about this medicine, talk to your doctor, pharmacist, or health care provider.  2021 Elsevier/Gold Standard (2020-03-14 17:16:00)

## 2020-06-11 NOTE — Progress Notes (Signed)
Assessment and Plan:  Matthew Wu was seen today for gi problem.  Diagnoses and all orders for this visit:  Gastroesophageal reflux disease without esophagitis Improving on omeprazole 20 mg BID Continue for 8 weeks then start slow taper; transition to H2i if tolerated Discussed diet, avoiding triggers and other lifestyle changes -     omeprazole (PRILOSEC) 20 MG capsule; Take 1 cap 30 min prior to breakfast and prior to bedtime for 8 weeks, then try reducing to once daily.  Hypertension, unspecified type Continue amlodipine 10 mg  Start HCTZ 12.5 mg daily x 2 weeks, increase to 25 mg if persistently above goal  Monitor blood pressure at home; call if consistently over 130/80, but for age ideally <120/80 Continue DASH diet, reduce processed, try "no salt"  Reminder to go to the ER if any CP, SOB, nausea, dizziness, severe HA, changes vision/speech, left arm numbness and tingling and jaw pain. Follow up in 4-6 weeks, check BMP/GFR then  -     amLODipine (NORVASC) 10 MG tablet; Taking 1 tab daily in the evening for blood pressure goal of <130/80. -     hydrochlorothiazide (HYDRODIURIL) 12.5 MG tablet; Start taking 1 tab daily in the morning for blood pressure goal < 130/70; if persistently above goal, increase to 2 tabs daily.  Obesity (BMI 30.0-34.9) Long discussion about weight loss, diet, and exercise Recommended diet heavy in fruits and veggies and low in animal meats, cheeses, and dairy products, appropriate calorie intake Discussed appropriate weight for height  Follow up at next visit    Further disposition pending results of labs. Discussed med's effects and SE's.   Over 30 minutes of exam, counseling, chart review, and critical decision making was performed.   Future Appointments  Date Time Provider Department Center  07/23/2020  3:30 PM Judd Gaudier, NP [facility] None  09/11/2020  3:00 PM Judd Gaudier, NP [facility] None     ------------------------------------------------------------------------------------------------------------------   HPI BP 134/88   Pulse 82   Temp 97.7 F (36.5 C)   Wt 216 lb (98 kg)   SpO2 98%   BMI 31.90 kg/m   25 y.o.male presents for f/u on GERD and BP. He is uninsured, cash pay.   He has hx of reflux last 2 years, onset after had strong alcohol, was improved with cutting out alcohol but has been getting worse in the last year, in the last month having reflux sx with water (admits onset after having a few beers), has been managing with tums (3-4 days a week), mylanta (PRN). He was initiated on omeprazole 20 mg BID last visit, has noted significant improvement in sx, still breakthrough with some fried foods.    Has been avoiding alcohol, minimal fried, minimal spicy food, rare coffee/soda. Denies NSAIDs.   His blood pressure has not been controlled at home (130/70-80s), today their BP is BP: 134/88, has been taking amlodipine 10 mg daily  He denies chest pain, shortness of breath, dizziness. He does report some intermittent vision changes, has been encouraged to present for evaluation of vision, reports planning to do this tomorrow.   BMI is Body mass index is 31.9 kg/m., he has been working on diet and exercise. Wt Readings from Last 3 Encounters:  06/11/20 216 lb (98 kg)  05/07/20 213 lb (96.6 kg)  07/22/19 215 lb 12.8 oz (97.9 kg)     Past Medical History:  Diagnosis Date  . Asthma      Allergies  Allergen Reactions  . Robitussin (Alcohol Free) [Guaifenesin]  Nausea And Vomiting    Current Outpatient Medications on File Prior to Visit  Medication Sig  . amLODipine (NORVASC) 10 MG tablet Taking 1 tab daily in the evening for blood pressure goal of <130/80.  . Calcium Carbonate Antacid (TUMS PO) Take by mouth as needed.  Marland Kitchen omeprazole (PRILOSEC) 20 MG capsule Take 1 cap 30 min prior to breakfast and prior to bedtime for 8 weeks, then try reducing to once daily.    No current facility-administered medications on file prior to visit.    ROS: all negative except above.   Physical Exam:  BP 134/88   Pulse 82   Temp 97.7 F (36.5 C)   Wt 216 lb (98 kg)   SpO2 98%   BMI 31.90 kg/m   General Appearance: Well nourished, in no apparent distress. Eyes: PERRLA, EOMs, conjunctiva no swelling or erythema Sinuses: No Frontal/maxillary tenderness ENT/Mouth: Ext aud canals clear, TMs without erythema, bulging. No erythema, swelling, or exudate on post pharynx.  Tonsils not swollen or erythematous. Hearing normal.  Neck: Supple, thyroid normal.  Respiratory: Respiratory effort normal, BS equal bilaterally without rales, rhonchi, wheezing or stridor.  Cardio: RRR with no MRGs. Brisk peripheral pulses without edema.  Abdomen: Soft, obese abdomen + BS.  Non tender, no guarding, rebound, hernias, masses. Lymphatics: Non tender without lymphadenopathy.  Musculoskeletal: Full ROM, 5/5 strength, normal gait.  Skin: Warm, dry without rashes, lesions, ecchymosis.  Neuro: Cranial nerves intact. Normal muscle tone, no cerebellar symptoms. Sensation intact.  Psych: Awake and oriented X 3, normal affect, Insight and Judgment appropriate.     Matthew Maker, NP 4:40 PM [facility]

## 2020-07-20 NOTE — Progress Notes (Deleted)
Assessment and Plan:  Matthew Wu was seen today for gi problem.  Diagnoses and all orders for this visit:  Gastroesophageal reflux disease without esophagitis Improving on omeprazole 20 mg BID Continue for 8 weeks then start slow taper; transition to H2i if tolerated Discussed diet, avoiding triggers and other lifestyle changes -     omeprazole (PRILOSEC) 20 MG capsule; Take 1 cap 30 min prior to breakfast and prior to bedtime for 8 weeks, then try reducing to once daily.  Hypertension, unspecified type Continue amlodipine 10 mg  Start HCTZ 12.5 mg daily x 2 weeks, increase to 25 mg if persistently above goal  Monitor blood pressure at home; call if consistently over 130/80, but for age ideally <120/80 Continue DASH diet, reduce processed, try "no salt"  Reminder to go to the ER if any CP, SOB, nausea, dizziness, severe HA, changes vision/speech, left arm numbness and tingling and jaw pain. Follow up in 4-6 weeks, check BMP/GFR then  -     amLODipine (NORVASC) 10 MG tablet; Taking 1 tab daily in the evening for blood pressure goal of <130/80. -     hydrochlorothiazide (HYDRODIURIL) 12.5 MG tablet; Start taking 1 tab daily in the morning for blood pressure goal < 130/70; if persistently above goal, increase to 2 tabs daily.  Obesity (BMI 30.0-34.9) Long discussion about weight loss, diet, and exercise Recommended diet heavy in fruits and veggies and low in animal meats, cheeses, and dairy products, appropriate calorie intake Discussed appropriate weight for height  Follow up at next visit    Further disposition pending results of labs. Discussed med's effects and SE's.   Over 30 minutes of exam, counseling, chart review, and critical decision making was performed.   Future Appointments  Date Time Provider Department Center  07/23/2020  3:30 PM Judd Gaudier, NP [facility] None  09/11/2020  3:00 PM Judd Gaudier, NP [facility] None     ------------------------------------------------------------------------------------------------------------------   HPI There were no vitals taken for this visit.  25 y.o.male presents for f/u on GERD and BP. He is uninsured, cash pay.   He has hx of reflux last 2 years, onset after had strong alcohol, was improved with cutting out alcohol but has been getting worse in the last year, in the last month having reflux sx with water (admits onset after having a few beers), has been managing with tums (3-4 days a week), mylanta (PRN). He was initiated on omeprazole 20 mg BID last visit, has noted significant improvement in sx, still breakthrough with some fried foods.    Has been avoiding alcohol, minimal fried, minimal spicy food, rare coffee/soda. Denies NSAIDs.   His blood pressure has not been controlled at home (130/70-80s), today their BP is  , has been taking amlodipine 10 mg daily, started on HCTZ *** He denies chest pain, shortness of breath, dizziness. He does report some intermittent vision changes, has been encouraged to present for evaluation of vision, reports planning to do this tomorrow.   BMI is There is no height or weight on file to calculate BMI., he has been working on diet and exercise. Wt Readings from Last 3 Encounters:  06/11/20 216 lb (98 kg)  05/07/20 213 lb (96.6 kg)  07/22/19 215 lb 12.8 oz (97.9 kg)     Past Medical History:  Diagnosis Date  . Asthma      Allergies  Allergen Reactions  . Robitussin (Alcohol Free) [Guaifenesin] Nausea And Vomiting    Current Outpatient Medications on File Prior to Visit  Medication Sig  . amLODipine (NORVASC) 10 MG tablet Taking 1 tab daily in the evening for blood pressure goal of <130/80.  . Calcium Carbonate Antacid (TUMS PO) Take by mouth as needed.  . hydrochlorothiazide (HYDRODIURIL) 12.5 MG tablet Start taking 1 tab daily in the morning for blood pressure goal < 130/70; if persistently above goal, increase to  2 tabs daily.  Marland Kitchen omeprazole (PRILOSEC) 20 MG capsule Take 1 cap 30 min prior to breakfast and prior to bedtime for 8 weeks, then try reducing to once daily.   No current facility-administered medications on file prior to visit.    ROS: all negative except above.   Physical Exam:  There were no vitals taken for this visit.  General Appearance: Well nourished, in no apparent distress. Eyes: PERRLA, EOMs, conjunctiva no swelling or erythema Sinuses: No Frontal/maxillary tenderness ENT/Mouth: Ext aud canals clear, TMs without erythema, bulging. No erythema, swelling, or exudate on post pharynx.  Tonsils not swollen or erythematous. Hearing normal.  Neck: Supple, thyroid normal.  Respiratory: Respiratory effort normal, BS equal bilaterally without rales, rhonchi, wheezing or stridor.  Cardio: RRR with no MRGs. Brisk peripheral pulses without edema.  Abdomen: Soft, obese abdomen + BS.  Non tender, no guarding, rebound, hernias, masses. Lymphatics: Non tender without lymphadenopathy.  Musculoskeletal: Full ROM, 5/5 strength, normal gait.  Skin: Warm, dry without rashes, lesions, ecchymosis.  Neuro: Cranial nerves intact. Normal muscle tone, no cerebellar symptoms. Sensation intact.  Psych: Awake and oriented X 3, normal affect, Insight and Judgment appropriate.     Dan Maker, NP 2:41 PM [facility]

## 2020-07-23 ENCOUNTER — Ambulatory Visit: Payer: Self-pay | Admitting: Adult Health

## 2020-07-23 DIAGNOSIS — E785 Hyperlipidemia, unspecified: Secondary | ICD-10-CM

## 2020-07-23 DIAGNOSIS — I10 Essential (primary) hypertension: Secondary | ICD-10-CM

## 2020-07-23 DIAGNOSIS — K219 Gastro-esophageal reflux disease without esophagitis: Secondary | ICD-10-CM

## 2020-07-23 DIAGNOSIS — E669 Obesity, unspecified: Secondary | ICD-10-CM

## 2020-07-23 DIAGNOSIS — E559 Vitamin D deficiency, unspecified: Secondary | ICD-10-CM

## 2020-08-08 ENCOUNTER — Other Ambulatory Visit: Payer: Self-pay

## 2020-08-08 ENCOUNTER — Encounter: Payer: Self-pay | Admitting: Adult Health

## 2020-08-08 ENCOUNTER — Ambulatory Visit (INDEPENDENT_AMBULATORY_CARE_PROVIDER_SITE_OTHER): Payer: Self-pay | Admitting: Adult Health

## 2020-08-08 VITALS — BP 140/88 | HR 91 | Temp 97.5°F | Wt 220.0 lb

## 2020-08-08 DIAGNOSIS — I10 Essential (primary) hypertension: Secondary | ICD-10-CM

## 2020-08-08 DIAGNOSIS — E669 Obesity, unspecified: Secondary | ICD-10-CM

## 2020-08-08 MED ORDER — HYDROCHLOROTHIAZIDE 25 MG PO TABS: ORAL_TABLET | ORAL | 1 refills | Status: DC

## 2020-08-08 MED ORDER — AMLODIPINE BESYLATE 10 MG PO TABS: ORAL_TABLET | ORAL | 1 refills | Status: DC

## 2020-08-08 NOTE — Patient Instructions (Signed)
"Eat the rainbow"  A great goal to work towards is aiming to get in a serving daily of some of the most nutritionally dense foods - G- BOMBS daily            When it comes to diets, agreement about the perfect plan isn't easy to find, even among the experts. Experts at the [facility] [employer] developed an idea known as the Healthy Eating Plate. Just imagine a plate divided into logical, healthy portions.  The emphasis is on diet quality:  Load up on vegetables and fruits - one-half of your plate: Aim for color and variety, and remember that potatoes don't count.  Go for whole grains - one-quarter of your plate: Whole wheat, barley, wheat berries, quinoa, oats, brown rice, and foods made with them. If you want pasta, go with whole wheat pasta.  Protein power - one-quarter of your plate: Fish, chicken, beans, and nuts are all healthy, versatile protein sources. Limit red meat.  The diet, however, does go beyond the plate, offering a few other suggestions.  Use healthy plant oils, such as olive, canola, soy, corn, sunflower and peanut. Check the labels, and avoid partially hydrogenated oil, which have unhealthy trans fats.  If you're thirsty, drink water. Coffee and tea are good in moderation, but skip sugary drinks and limit milk and dairy products to one or two daily servings.  The type of carbohydrate in the diet is more important than the amount. Some sources of carbohydrates, such as vegetables, fruits, whole grains, and beans--are healthier than others.  Finally, stay active.      High-Fiber Eating Plan Fiber, also called dietary fiber, is a type of carbohydrate. It is found foods such as fruits, vegetables, whole grains, and beans. A high-fiber diet can have many health benefits. Your health care provider may recommend a high-fiber diet to help:  Prevent constipation. Fiber can make your bowel movements more regular.  Lower your cholesterol.  Relieve the  following conditions: ? Inflammation of veins in the anus (hemorrhoids). ? Inflammation of specific areas of the digestive tract (uncomplicated diverticulosis). ? A problem of the large intestine, also called the colon, that sometimes causes pain and diarrhea (irritable bowel syndrome, or IBS).  Prevent overeating as part of a weight-loss plan.  Prevent heart disease, type 2 diabetes, and certain cancers. What are tips for following this plan? Reading food labels  Check the nutrition facts label on food products for the amount of dietary fiber. Choose foods that have 5 grams of fiber or more per serving.  The goals for recommended daily fiber intake include: ? Men (age 34 or younger): 34-38 g. ? Men (over age 24): 28-34 g. ? Women (age 15 or younger): 25-28 g. ? Women (over age 31): 22-25 g. Your daily fiber goal is _____________ g.   Shopping  Choose whole fruits and vegetables instead of processed forms, such as apple juice or applesauce.  Choose a wide variety of high-fiber foods such as avocados, lentils, oats, and kidney beans.  Read the nutrition facts label of the foods you choose. Be aware of foods with added fiber. These foods often have high sugar and sodium amounts per serving. Cooking  Use whole-grain flour for baking and cooking.  Cook with brown rice instead of white rice. Meal planning  Start the day with a breakfast that is high in fiber, such as a cereal that contains 5 g of fiber or more per serving.  Eat breads and  cereals that are made with whole-grain flour instead of refined flour or white flour.  Eat brown rice, bulgur wheat, or millet instead of white rice.  Use beans in place of meat in soups, salads, and pasta dishes.  Be sure that half of the grains you eat each day are whole grains. General information  You can get the recommended daily intake of dietary fiber by: ? Eating a variety of fruits, vegetables, grains, nuts, and beans. ? Taking a  fiber supplement if you are not able to take in enough fiber in your diet. It is better to get fiber through food than from a supplement.  Gradually increase how much fiber you consume. If you increase your intake of dietary fiber too quickly, you may have bloating, cramping, or gas.  Drink plenty of water to help you digest fiber.  Choose high-fiber snacks, such as berries, raw vegetables, nuts, and popcorn. What foods should I eat? Fruits Berries. Pears. Apples. Oranges. Avocado. Prunes and raisins. Dried figs. Vegetables Sweet potatoes. Spinach. Kale. Artichokes. Cabbage. Broccoli. Cauliflower. Green peas. Carrots. Squash. Grains Whole-grain breads. Multigrain cereal. Oats and oatmeal. Brown rice. Barley. Bulgur wheat. Millet. Quinoa. Bran muffins. Popcorn. Rye wafer crackers. Meats and other proteins Navy beans, kidney beans, and pinto beans. Soybeans. Split peas. Lentils. Nuts and seeds. Dairy Fiber-fortified yogurt. Beverages Fiber-fortified soy milk. Fiber-fortified orange juice. Other foods Fiber bars. The items listed above may not be a complete list of recommended foods and beverages. Contact a dietitian for more information. What foods should I avoid? Fruits Fruit juice. Cooked, strained fruit. Vegetables Fried potatoes. Canned vegetables. Well-cooked vegetables. Grains White bread. Pasta made with refined flour. White rice. Meats and other proteins Fatty cuts of meat. Fried chicken or fried fish. Dairy Milk. Yogurt. Cream cheese. Sour cream. Fats and oils Butters. Beverages Soft drinks. Other foods Cakes and pastries. The items listed above may not be a complete list of foods and beverages to avoid. Talk with your dietitian about what choices are best for you. Summary  Fiber is a type of carbohydrate. It is found in foods such as fruits, vegetables, whole grains, and beans.  A high-fiber diet has many benefits. It can help to prevent constipation, lower blood  cholesterol, aid weight loss, and reduce your risk of heart disease, diabetes, and certain cancers.  Increase your intake of fiber gradually. Increasing fiber too quickly may cause cramping, bloating, and gas. Drink plenty of water while you increase the amount of fiber you consume.  The best sources of fiber include whole fruits and vegetables, whole grains, nuts, seeds, and beans. This information is not intended to replace advice given to you by your health care provider. Make sure you discuss any questions you have with your health care provider. Document Revised: 09/08/2019 Document Reviewed: 09/08/2019 Elsevier Patient Education  2021 [employer].

## 2020-08-08 NOTE — Progress Notes (Signed)
Assessment and Plan:  Matthew Wu was seen today for gi problem.  Diagnoses and all orders for this visit:  Gastroesophageal reflux disease without esophagitis Controlled on omeprazole  Continue for 4 weeks then start slow taper; transition to H2i if tolerated Discussed diet, avoiding triggers and other lifestyle changes -     omeprazole (PRILOSEC) 20 MG capsule; take 1 cap daily   Hypertension, unspecified type Continue amlodipine 10 mg  Increase HCTZ 25 mg  Monitor blood pressure at home; call if consistently over 130/80, but for age ideally <120/80 Continue DASH diet, reduce processed, try "no salt"  Reminder to go to the ER if any CP, SOB, nausea, dizziness, severe HA, changes vision/speech, left arm numbness and tingling and jaw pain. Follow up in 4-6 weeks, check BMP/GFR then  -     amLODipine (NORVASC) 10 MG tablet; Taking 1 tab daily in the evening for blood pressure goal of <130/80. -     hydrochlorothiazide (HYDRODIURIL) 25 MG tablet daily.  Obesity (BMI 30.0-34.9) Long discussion about weight loss, diet, and exercise Recommended diet heavy in fruits and veggies and low in animal meats, cheeses, and dairy products, appropriate calorie intake "Eat the rainbow," daily fiber 35-40+ mg daily Discussed appropriate weight for height  Follow up at next visit   Defer labs to next OV, no charge uninsured and recently lost job  Discussed med's effects and SE's.   Over 30 minutes of exam, counseling, chart review, and critical decision making was performed.   Future Appointments  Date Time Provider Department Center  09/11/2020  3:00 PM Judd Gaudier, NP [facility] None    ------------------------------------------------------------------------------------------------------------------   HPI BP 140/88   Pulse 91   Temp (!) 97.5 F (36.4 C)   Wt 220 lb (99.8 kg)   SpO2 97%   BMI 32.49 kg/m   25 y.o.male presents for f/u on GERD and BP. He is uninsured, cash pay. Recently  was let go from job, feels stress is actually improved; has savings, taking his time to look at options, doing some door dash.   He has recurrent GERD flares; he was initiated on omeprazole, reports sx have essentially resolved. Has been avoiding alcohol, minimal fried, minimal spicy food, rare coffee/soda. Denies NSAIDs.   His blood pressure has not been controlled at home (133-140/70-80s), today their BP is BP: 140/88, has been taking amlodipine 10 mg daily, started on HCTZ 12.5 with instructions to taper to 25 but never did this. Still taking 12.5 mg daily.   He denies chest pain, shortness of breath, dizziness.   BMI is Body mass index is 32.49 kg/m., he has been working on diet, admits minimal exercise He has been drinking water only  Doesn't eat until 11 - will have grits with Ms. Dash, pepper, 2 eggs Then doesn't eat until dinner - 5-6 pm, will do cookout on grill, hamburger or chicken (uses Print production planner) with corn on the cob, baked beans, or take out (chipotle protein bowl, pinto beans, lettuce, corn salsa, sour cream).  Wt Readings from Last 3 Encounters:  08/08/20 220 lb (99.8 kg)  06/11/20 216 lb (98 kg)  05/07/20 213 lb (96.6 kg)     Past Medical History:  Diagnosis Date  . Asthma      Allergies  Allergen Reactions  . Robitussin (Alcohol Free) [Guaifenesin] Nausea And Vomiting    Current Outpatient Medications on File Prior to Visit  Medication Sig  . amLODipine (NORVASC) 10 MG tablet Taking 1 tab daily in the  evening for blood pressure goal of <130/80.  . Calcium Carbonate Antacid (TUMS PO) Take by mouth as needed.  . hydrochlorothiazide (HYDRODIURIL) 12.5 MG tablet Start taking 1 tab daily in the morning for blood pressure goal < 130/70; if persistently above goal, increase to 2 tabs daily.  Marland Kitchen omeprazole (PRILOSEC) 20 MG capsule Take 1 cap 30 min prior to breakfast and prior to bedtime for 8 weeks, then try reducing to once daily.   No current facility-administered  medications on file prior to visit.    ROS: all negative except above.   Physical Exam:  BP 140/88   Pulse 91   Temp (!) 97.5 F (36.4 C)   Wt 220 lb (99.8 kg)   SpO2 97%   BMI 32.49 kg/m   General Appearance: Well nourished, in no apparent distress. Eyes: PERRLA, EOMs, conjunctiva no swelling or erythema Sinuses: No Frontal/maxillary tenderness ENT/Mouth: Ext aud canals clear, TMs without erythema, bulging. No erythema, swelling, or exudate on post pharynx.  Tonsils not swollen or erythematous. Hearing normal.  Neck: Supple, thyroid normal.  Respiratory: Respiratory effort normal, BS equal bilaterally without rales, rhonchi, wheezing or stridor.  Cardio: RRR with no MRGs. Brisk peripheral pulses without edema.  Abdomen: Soft, obese abdomen + BS.  Non tender, no guarding, rebound, hernias, masses. Lymphatics: Non tender without lymphadenopathy.  Musculoskeletal: Full ROM, 5/5 strength, normal gait.  Skin: Warm, dry without rashes, lesions, ecchymosis.  Neuro: Cranial nerves intact. Normal muscle tone, no cerebellar symptoms. Sensation intact.  Psych: Awake and oriented X 3, normal affect, Insight and Judgment appropriate.     Dan Maker, NP 11:45 AM [facility]

## 2020-08-28 ENCOUNTER — Other Ambulatory Visit: Payer: Self-pay | Admitting: *Deleted

## 2020-08-28 DIAGNOSIS — K219 Gastro-esophageal reflux disease without esophagitis: Secondary | ICD-10-CM

## 2020-08-28 DIAGNOSIS — I10 Essential (primary) hypertension: Secondary | ICD-10-CM

## 2020-08-28 MED ORDER — OMEPRAZOLE 20 MG PO CPDR: DELAYED_RELEASE_CAPSULE | ORAL | 0 refills | Status: DC

## 2020-08-28 MED ORDER — AMLODIPINE BESYLATE 10 MG PO TABS: ORAL_TABLET | ORAL | 0 refills | Status: DC

## 2020-09-11 ENCOUNTER — Encounter: Payer: Self-pay | Admitting: Adult Health

## 2021-02-21 ENCOUNTER — Emergency Department: Payer: Self-pay

## 2021-02-21 ENCOUNTER — Encounter: Payer: Self-pay | Admitting: Emergency Medicine

## 2021-02-21 ENCOUNTER — Emergency Department
Admission: EM | Admit: 2021-02-21 | Discharge: 2021-02-21 | Disposition: A | Discharge status: Home / Self Care | Payer: Self-pay | Attending: Emergency Medicine | Admitting: Emergency Medicine

## 2021-02-21 ENCOUNTER — Other Ambulatory Visit: Payer: Self-pay

## 2021-02-21 DIAGNOSIS — Z20822 Contact with and (suspected) exposure to covid-19: Secondary | ICD-10-CM | POA: Insufficient documentation

## 2021-02-21 DIAGNOSIS — J45909 Unspecified asthma, uncomplicated: Secondary | ICD-10-CM | POA: Insufficient documentation

## 2021-02-21 DIAGNOSIS — J029 Acute pharyngitis, unspecified: Secondary | ICD-10-CM

## 2021-02-21 DIAGNOSIS — I10 Essential (primary) hypertension: Secondary | ICD-10-CM | POA: Insufficient documentation

## 2021-02-21 DIAGNOSIS — Z79899 Other long term (current) drug therapy: Secondary | ICD-10-CM | POA: Insufficient documentation

## 2021-02-21 DIAGNOSIS — J069 Acute upper respiratory infection, unspecified: Secondary | ICD-10-CM | POA: Insufficient documentation

## 2021-02-21 LAB — RESP PANEL BY RT-PCR (FLU A&B, COVID) ARPGX2
Influenza A by PCR: NEGATIVE
Influenza B by PCR: NEGATIVE
SARS Coronavirus 2 by RT PCR: NEGATIVE

## 2021-02-21 MED ORDER — FLUTICASONE PROPIONATE 50 MCG/ACT NA SUSP: 2.0000 | Freq: Every day | NASAL | 0 refills | Status: DC

## 2021-02-21 NOTE — Discharge Instructions (Signed)
Please continue with Tylenol and ibuprofen as needed for fevers chills and body aches.  Continue with Mucinex as needed for symptoms.  Use Flonase as needed for nasal congestion.  Make sure you are drinking lots of fluids.  COVID test is pending if positive you will need to quarantine properly.  Return to the ER for any shortness of breath, worsening cough, fevers above 101 that are not going down with Tylenol/ibuprofen or any urgent changes in your health.

## 2021-02-21 NOTE — ED Triage Notes (Signed)
Pt to ED from home c/o sore throat since Friday, painful to swallow, productive bright yellow cough, temp of 100 highest at home.  Pt attempted to go to Urgent Care but did not have ID and was told to try the ER.  Pt A&Ox4, speech clear, chest rise even and unlabored, in NAD at this time.

## 2021-02-21 NOTE — ED Provider Notes (Signed)
[facility] EMERGENCY DEPARTMENT Provider Note   CSN: [id number] Arrival date & time: 02/21/21  1915     History Chief Complaint  Patient presents with   Sore Throat    Matthew Wu is a 25 y.o. male.  Presents to the emergency department evaluation of chills, fever, sore throat, cough congestion runny nose.  Patient states for 2 days he has had cold symptoms.  Denies any chest pain, shortness of breath or wheezing.  No nausea vomiting or diarrhea.  Has had a lot of posterior pharyngeal drainage with nasal congestion and a nonproductive cough.  He denies any rashes.  Fevers are controlled with over-the-counter medication.  He has been taking cold and flu Mucinex with some relief  HPI     Past Medical History:  Diagnosis Date   Asthma     Patient Active Problem List   Diagnosis Date Noted   Obesity (BMI 30.0-34.9) 05/07/2020   Hypertension 07/15/2018   Anxiety 07/15/2018   Dizziness, nonspecific 07/15/2018   Acid reflux 07/15/2018   Vitamin D deficiency 06/01/2015   Hyperlipidemia 06/01/2015    History reviewed. No pertinent surgical history.     Family History  Problem Relation Age of Onset   Hypertension Other     Social History   Tobacco Use   Smoking status: Never   Smokeless tobacco: Never  Substance Use Topics   Alcohol use: No   Drug use: No    Home Medications Prior to Admission medications   Medication Sig Start Date End Date Taking? Authorizing Provider  fluticasone (FLONASE) 50 MCG/ACT nasal spray Place 2 sprays into both nostrils daily. 02/21/21 02/21/22 Yes Evon Slack, PA-C  amLODipine (NORVASC) 10 MG tablet Taking 1 tab daily in the evening for blood pressure goal of <130/80. 08/28/20   Judd Gaudier, NP  Calcium Carbonate Antacid (TUMS PO) Take by mouth as needed.    [provider]  hydrochlorothiazide (HYDRODIURIL) 25 MG tablet Take 1 tab daily in the morning for blood pressure goal < 130/70 08/08/20    Judd Gaudier, NP  omeprazole (PRILOSEC) 20 MG capsule Take 1 cap 30 min prior to breakfast and prior to bedtime for 8 weeks, then try reducing to once daily. 08/28/20   Judd Gaudier, NP    Allergies    Robitussin (alcohol free) [guaifenesin]  Review of Systems   Review of Systems  Constitutional:  Positive for chills and fever.  HENT:  Positive for congestion, rhinorrhea and sore throat. Negative for sinus pressure and sinus pain.   Respiratory:  Positive for cough. Negative for wheezing and stridor.   Gastrointestinal:  Negative for diarrhea, nausea and vomiting.  Musculoskeletal:  Positive for myalgias. Negative for arthralgias and neck stiffness.  Skin:  Negative for rash.  Neurological:  Negative for dizziness.   Physical Exam Updated Vital Signs BP (!) 163/93 (BP Location: Left Arm)   Pulse (!) 101   Temp 98.4 F (36.9 C) (Oral)   Resp 16   Ht 5\' 9"  (1.753 m)   Wt 97.5 kg   SpO2 98%   BMI 31.75 kg/m   Physical Exam Constitutional:      Appearance: He is well-developed.  HENT:     Head: Normocephalic and atraumatic.     Right Ear: Tympanic membrane and ear canal normal.     Left Ear: Tympanic membrane and ear canal normal.     Mouth/Throat:     Mouth: Mucous membranes are moist. Mucous membranes are pale. No  oral lesions.     Pharynx: Oropharynx is clear. No oropharyngeal exudate, posterior oropharyngeal erythema or uvula swelling.     Tonsils: No tonsillar exudate.  Eyes:     Conjunctiva/sclera: Conjunctivae normal.  Cardiovascular:     Rate and Rhythm: Normal rate.     Heart sounds: Normal heart sounds.  Pulmonary:     Effort: Pulmonary effort is normal. No respiratory distress.     Breath sounds: Normal breath sounds. No stridor. No wheezing, rhonchi or rales.  Abdominal:     General: There is no distension.     Tenderness: There is no abdominal tenderness. There is no rebound.  Musculoskeletal:        General: Normal range of motion.     Cervical  back: Normal range of motion.  Skin:    General: Skin is warm.     Findings: No rash.  Neurological:     General: No focal deficit present.     Mental Status: He is alert and oriented to person, place, and time.  Psychiatric:        Mood and Affect: Mood normal.        Behavior: Behavior normal.        Thought Content: Thought content normal.    ED Results / Procedures / Treatments   Labs (all labs ordered are listed, but only abnormal results are displayed) Labs Reviewed  RESP PANEL BY RT-PCR (FLU A&B, COVID) [id number]    EKG None  Radiology DG Chest 2 View  Result Date: 02/21/2021 CLINICAL DATA:  Productive cough for several days, initial encounter EXAM: CHEST - 2 VIEW COMPARISON:  01/30/2018 FINDINGS: The heart size and mediastinal contours are within normal limits. Both lungs are clear. The visualized skeletal structures are unremarkable. IMPRESSION: No active cardiopulmonary disease. Electronically Signed   By: Alcide Clever M.D.   On: 02/21/2021 20:04    Procedures Procedures   Medications Ordered in ED Medications - No data to display  ED Course  I have reviewed the triage vital signs and the nursing notes.  Pertinent labs & imaging results that were available during my care of the patient were reviewed by me and considered in my medical decision making (see chart for details).    MDM Rules/Calculators/A&P                         25 year old with viral respiratory infection with viral pharyngitis.  No signs of bacterial infection.  Lungs are clear to auscultation with no wheezing rales or rhonchi.  Normal O2 sats.  COVID flu test is pending.  Patient will continue with over-the-counter medications and is given a prescription for Flonase to help with nasal congestion.  He understands signs and symptoms return to the ER for such as worsening cough, fevers, wheezing, chest pain or shortness of breath Final Clinical Impression(s) / ED Diagnoses Final diagnoses:  Viral  pharyngitis  Viral URI with cough    Rx / DC Orders ED Discharge Orders          Ordered    fluticasone (FLONASE) 50 MCG/ACT nasal spray  Daily        02/21/21 2042             Ronnette Juniper 02/21/21 2045    Sharman Cheek, MD 02/22/21 0122

## 2021-08-08 ENCOUNTER — Encounter: Payer: Self-pay | Admitting: Gastroenterology

## 2021-08-08 ENCOUNTER — Ambulatory Visit (INDEPENDENT_AMBULATORY_CARE_PROVIDER_SITE_OTHER): Payer: Self-pay | Admitting: Gastroenterology

## 2021-08-08 ENCOUNTER — Other Ambulatory Visit: Payer: Self-pay

## 2021-08-08 VITALS — BP 176/103 | HR 125 | Temp 98.6°F | Ht 69.0 in | Wt 220.0 lb

## 2021-08-08 DIAGNOSIS — R197 Diarrhea, unspecified: Secondary | ICD-10-CM

## 2021-08-08 DIAGNOSIS — K219 Gastro-esophageal reflux disease without esophagitis: Secondary | ICD-10-CM

## 2021-08-08 NOTE — Progress Notes (Signed)
? ? ?Gastroenterology Consultation ? ?Referring Provider:     Unk Pinto, MD ?Primary Care Physician:  Unk Pinto, MD ?Primary Gastroenterologist:  Dr. Allen Norris     ?Reason for Consultation:     Multiple GI issues ?      ? HPI:   ?Matthew Wu is a 26 y.o. y/o male referred for consultation & management of Multiple GI issues by Dr. Unk Pinto, MD.  This patient comes in today with a report of GI issues for many years.  The patient states that he has been having reflux symptoms multiple times a week.  He has also been stating that his symptoms get worse when he eats spicy foods and this has been going on since 2020.  The patient has heartburn with more vegetables and spicy foods.  He also reports that he has diarrhea chronically 50% of the time and states that he usually will have a bowel movement every hour when is at the worse. ?The patient denies any unexplained weight loss fevers chills nausea vomiting black stools or bloody stools.  The patient does report that he has foul smelling stools but denies them to be greasy. ? ?Past Medical History:  ?Diagnosis Date  ? Asthma   ? ? ?No past surgical history on file. ? ?Prior to Admission medications   ?Medication Sig Start Date End Date Taking? Authorizing Provider  ?amLODipine (NORVASC) 10 MG tablet Taking 1 tab daily in the evening for blood pressure goal of <130/80. 08/28/20  Yes Liane Comber, NP  ?Calcium Carbonate Antacid (TUMS PO) Take by mouth as needed.   Yes [provider]  ?fluticasone (FLONASE) 50 MCG/ACT nasal spray Place 2 sprays into both nostrils daily. 02/21/21 02/21/22 Yes Duanne Guess, PA-C  ?hydrochlorothiazide (HYDRODIURIL) 25 MG tablet Take 1 tab daily in the morning for blood pressure goal < 130/70 08/08/20  Yes Liane Comber, NP  ?omeprazole (PRILOSEC) 20 MG capsule Take 1 cap 30 min prior to breakfast and prior to bedtime for 8 weeks, then try reducing to once daily. 08/28/20  Yes Liane Comber, NP  ? ? ?Family  History  ?Problem Relation Age of Onset  ? Hypertension Other   ?  ? ?Social History  ? ?Tobacco Use  ? Smoking status: Never  ? Smokeless tobacco: Never  ?Substance Use Topics  ? Alcohol use: No  ? Drug use: No  ? ? ?Allergies as of 08/08/2021 - Review Complete 08/08/2021  ?Allergen Reaction Noted  ? Robitussin (alcohol free) [guaifenesin] Nausea And Vomiting 05/30/2015  ? ? ?Review of Systems:    ?All systems reviewed and negative except where noted in HPI. ? ? Physical Exam:  ?BP (!) 176/103   Pulse (!) 125   Temp 98.6 ?F (37 ?C) (Oral)   Ht 5\' 9"  (1.753 m)   Wt 220 lb (99.8 kg)   BMI 32.49 kg/m?  ?No LMP for male patient. ?General:   Alert,  Well-developed, well-nourished, pleasant and cooperative in NAD ?Head:  Normocephalic and atraumatic. ?Eyes:  Sclera clear, no icterus.   Conjunctiva pink. ?Ears:  Normal auditory acuity. ?Neck:  Supple; no masses or thyromegaly. ?Lungs:  Respirations even and unlabored.  Clear throughout to auscultation.   No wheezes, crackles, or rhonchi. No acute distress. ?Heart:  Regular rate and rhythm; no murmurs, clicks, rubs, or gallops. ?Abdomen:  Normal bowel sounds.  No bruits.  Soft, non-tender and non-distended without masses, hepatosplenomegaly or hernias noted.  No guarding or rebound tenderness.  Negative Carnett sign.   ?  Rectal:  Deferred.  ?Pulses:  Normal pulses noted. ?Extremities:  No clubbing or edema.  No cyanosis. ?Neurologic:  Alert and oriented x3;  grossly normal neurologically. ?Skin:  Intact without significant lesions or rashes.  No jaundice. ?Lymph Nodes:  No significant cervical adenopathy. ?Psych:  Alert and cooperative. Normal mood and affect. ? ?Imaging Studies: ?No results found. ? ?Assessment and Plan:  ? ?Khani Ebel is a 26 y.o. y/o male who comes in today with GERD-like symptoms with foul smelling stools and diarrhea. The patient will be started on a PPI and will also be set up for blood work to look for Celiac sprue and will have stool sent  off for fecal Protection and fecal fat.  The patient will be notified of the results of these labs.  The patient has been explained the plan and agrees with it. ? ? ? ?Lucilla Lame, MD. Marval Regal ? ? ? Note: This dictation was prepared with Dragon dictation along with smaller phrase technology. Any transcriptional errors that result from this process are unintentional.   ?

## 2021-08-10 ENCOUNTER — Other Ambulatory Visit: Payer: Self-pay | Admitting: Adult Health

## 2021-08-10 DIAGNOSIS — I10 Essential (primary) hypertension: Secondary | ICD-10-CM

## 2021-08-10 MED ORDER — AMLODIPINE BESYLATE 10 MG PO TABS: ORAL_TABLET | ORAL | 0 refills | Status: DC

## 2021-08-11 LAB — CELIAC DISEASE PANEL
Endomysial IgA: NEGATIVE
IgA/Immunoglobulin A, Serum: 323 mg/dL (ref 90–386)
Transglutaminase IgA: <2 U/mL (ref 0–3)

## 2021-08-21 ENCOUNTER — Telehealth: Payer: Self-pay

## 2021-08-21 NOTE — Telephone Encounter (Signed)
-----   Message from Midge Minium, MD sent at 08/11/2021  7:54 AM EDT ----- ?Please let the patient know that we are still waiting for the Stool studies But the blood test did not show him to have any allergy to wheat products. ?

## 2021-08-21 NOTE — Telephone Encounter (Signed)
Tried to call patient but mailbox is full sent mychart message  

## 2021-08-22 ENCOUNTER — Other Ambulatory Visit: Payer: Self-pay | Admitting: Gastroenterology

## 2021-08-27 LAB — CALPROTECTIN, FECAL: Calprotectin, Fecal: 42 ug/g (ref 0–120)

## 2021-08-27 LAB — FECAL FAT, QUALITATIVE
Fat Qual Neutral, Stl: NORMAL
Fat Qual Total, Stl: NORMAL

## 2021-09-11 ENCOUNTER — Encounter: Payer: Self-pay | Admitting: Adult Health

## 2021-09-11 NOTE — Progress Notes (Deleted)
Complete Physical  Assessment and Plan:     Discussed med's effects and SE's. Screening labs and tests as requested with regular follow-up as recommended. Over 40 minutes of exam, counseling, chart review, and complex, high level critical decision making was performed this visit.   Future Appointments  Date Time Provider [place]  09/11/2021  3:00 PM Liane Comber, NP [facility] None  09/15/2022  3:00 PM Magda Bernheim, NP [facility] None    HPI  26 y.o. male  presents for a complete physical. He has Vitamin D deficiency; Hyperlipidemia; Hypertension; Anxiety; Dizziness, nonspecific; Acid reflux; and Obesity (BMI 30.0-34.9) on their problem list.     BMI is There is no height or weight on file to calculate BMI., he {HAS HAS OQH:47654} been working on diet and exercise. Wt Readings from Last 3 Encounters:  08/08/21 220 lb (99.8 kg)  02/21/21 215 lb (97.5 kg)  08/08/20 220 lb (99.8 kg)   His blood pressure {HAS HAS NOT:18834} been controlled at home, today their BP is   He {DOES_DOES YTK:35465} workout. He denies chest pain, shortness of breath, dizziness.  He {ACTION; IS/IS KCL:27517001} on cholesterol medication and denies myalgias. His cholesterol {ACTION; IS/IS NOT:21021397} at goal. The cholesterol last visit was:  No results found for: CHOL, HDL, LDLCALC, LDLDIRECT, TRIG, CHOLHDL  He {Has/has not:18111} been working on diet and exercise for {ACTION; ABNORMAL GLUCOSE/PREDIABETES/T2DM:21021397}, he {ACTION; IS/IS NOT:21021397} on bASA, he {ACTION; IS/IS VCB:44967591} on ACE/ARB and denies {Symptoms; diabetes w/o none:19199}. Last A1C in the office was: No results found for: HGBA1C Last GFR: No results found for: EGFR Patient is on Vitamin D supplement.   No results found for: VD25OH   Last PSA was: No results found for: PSA   Current Medications:  Current Outpatient Medications on File Prior to Visit  Medication Sig Dispense Refill   amLODipine (NORVASC) 10 MG  tablet Taking 1 tab daily in the evening for blood pressure goal of <130/80. 90 tablet 0   Calcium Carbonate Antacid (TUMS PO) Take by mouth as needed.     fluticasone (FLONASE) 50 MCG/ACT nasal spray Place 2 sprays into both nostrils daily. 9.9 mL 0   hydrochlorothiazide (HYDRODIURIL) 25 MG tablet Take 1 tab daily in the morning for blood pressure goal < 130/70 90 tablet 1   omeprazole (PRILOSEC) 20 MG capsule Take 1 cap 30 min prior to breakfast and prior to bedtime for 8 weeks, then try reducing to once daily. 180 capsule 0   No current facility-administered medications on file prior to visit.   Allergies:  Allergies  Allergen Reactions   Robitussin (Alcohol Free) [Guaifenesin] Nausea And Vomiting   Medical History:  He has Vitamin D deficiency; Hyperlipidemia; Hypertension; Anxiety; Dizziness, nonspecific; Acid reflux; and Obesity (BMI 30.0-34.9) on their problem list.  Health Maintenance:    There is no immunization history on file for this patient. Health Maintenance  Topic Date Due   COVID-19 Vaccine (1) Never done   HIV Screening  Never done   Hepatitis C Screening  Never done   TETANUS/TDAP  Never done   INFLUENZA VACCINE  12/17/2021   HPV VACCINES  Aged Out    Sexually Active: {YES MB:84665} STD testing offered  Colonoscopy: EGD:  Last Dental Exam: Last Eye Exam: Last Derm Exam:   Patient Care Team: Unk Pinto, MD as PCP - General (Internal Medicine)  Surgical History:  He has no past surgical history on file. Family History:  Hisfamily history includes Hypertension in an  other family member. Social History:  He reports that he has never smoked. He has never used smokeless tobacco. He reports that he does not drink alcohol and does not use drugs.  Review of Systems: ROS  Physical Exam: Estimated body mass index is 32.49 kg/m as calculated from the following:   Height as of 08/08/21: 5' 9" (1.753 m).   Weight as of 08/08/21: 220 lb (99.8 kg). There  were no vitals taken for this visit.  General Appearance: Well nourished, well developed, in no apparent distress.  Eyes: PERRLA, EOMs, conjunctiva no swelling or erythema, normal fundi and vessels.  Sinuses: No Frontal/maxillary tenderness  ENT/Mouth: Ext aud canals clear, normal light reflex with TMs without erythema, bulging. Good dentition. No erythema, swelling, or exudate on post pharynx. Tonsils not swollen or erythematous. Hearing normal.  Neck: Supple, thyroid normal. No bruits  Respiratory: Respiratory effort normal, BS equal bilaterally without rales, rhonchi, wheezing or stridor.  Cardio: RRR without murmurs, rubs or gallops. Brisk peripheral pulses without edema.  Chest: symmetric, with normal excursions and percussion.  Abdomen: Soft, nontender, no guarding, rebound, hernias, masses, or organomegaly.  Lymphatics: Non tender without lymphadenopathy.  Musculoskeletal: Full ROM all peripheral extremities,5/5 strength, and normal gait.  Skin: Warm, dry without rashes, lesions, ecchymosis. Neuro: Cranial nerves intact, reflexes equal bilaterally. Normal muscle tone, no cerebellar symptoms. Sensation intact.  Psych: Awake and oriented X 3, normal affect, Insight and Judgment appropriate.  Genitourinary: {genitalia exam:3041430}  EKG: WNL no ST changes. ***  Ashley C Corbett, NP 2:08 PM [facility]  

## 2021-11-20 ENCOUNTER — Other Ambulatory Visit: Payer: Self-pay | Admitting: Adult Health

## 2021-11-20 DIAGNOSIS — K219 Gastro-esophageal reflux disease without esophagitis: Secondary | ICD-10-CM

## 2021-11-20 DIAGNOSIS — I10 Essential (primary) hypertension: Secondary | ICD-10-CM

## 2021-11-20 MED ORDER — OMEPRAZOLE 20 MG PO CPDR: DELAYED_RELEASE_CAPSULE | ORAL | 0 refills | Status: DC

## 2021-11-20 MED ORDER — AMLODIPINE BESYLATE 10 MG PO TABS: ORAL_TABLET | ORAL | 0 refills | Status: DC

## 2021-12-02 ENCOUNTER — Other Ambulatory Visit: Payer: Self-pay | Admitting: Nurse Practitioner

## 2021-12-02 DIAGNOSIS — K219 Gastro-esophageal reflux disease without esophagitis: Secondary | ICD-10-CM

## 2021-12-06 ENCOUNTER — Other Ambulatory Visit: Payer: Self-pay | Admitting: Nurse Practitioner

## 2021-12-06 DIAGNOSIS — I10 Essential (primary) hypertension: Secondary | ICD-10-CM

## 2021-12-12 ENCOUNTER — Encounter: Payer: Self-pay | Admitting: Nurse Practitioner

## 2021-12-22 ENCOUNTER — Emergency Department: Payer: Self-pay

## 2021-12-22 ENCOUNTER — Emergency Department
Admission: EM | Admit: 2021-12-22 | Discharge: 2021-12-22 | Disposition: A | Discharge status: Home / Self Care | Payer: Self-pay | Attending: Emergency Medicine | Admitting: Emergency Medicine

## 2021-12-22 ENCOUNTER — Other Ambulatory Visit: Payer: Self-pay

## 2021-12-22 ENCOUNTER — Encounter: Payer: Self-pay | Admitting: Emergency Medicine

## 2021-12-22 DIAGNOSIS — I10 Essential (primary) hypertension: Secondary | ICD-10-CM | POA: Insufficient documentation

## 2021-12-22 DIAGNOSIS — D72829 Elevated white blood cell count, unspecified: Secondary | ICD-10-CM | POA: Insufficient documentation

## 2021-12-22 DIAGNOSIS — R002 Palpitations: Secondary | ICD-10-CM | POA: Insufficient documentation

## 2021-12-22 DIAGNOSIS — R000 Tachycardia, unspecified: Secondary | ICD-10-CM | POA: Insufficient documentation

## 2021-12-22 DIAGNOSIS — R0602 Shortness of breath: Secondary | ICD-10-CM | POA: Insufficient documentation

## 2021-12-22 DIAGNOSIS — Z79899 Other long term (current) drug therapy: Secondary | ICD-10-CM | POA: Insufficient documentation

## 2021-12-22 DIAGNOSIS — J45909 Unspecified asthma, uncomplicated: Secondary | ICD-10-CM | POA: Insufficient documentation

## 2021-12-22 LAB — BASIC METABOLIC PANEL
Anion gap: 5 (ref 5–15)
BUN: 13 mg/dL (ref 6–20)
CO2: 28 mmol/L (ref 22–32)
Calcium: 9.1 mg/dL (ref 8.9–10.3)
Chloride: 108 mmol/L (ref 98–111)
Creatinine, Ser: 1.06 mg/dL (ref 0.61–1.24)
GFR, Estimated: >60 mL/min (ref >60)
Glucose, Bld: 152 mg/dL — ABNORMAL HIGH (ref 70–99)
Potassium: 3.8 mmol/L (ref 3.5–5.1)
Sodium: 141 mmol/L (ref 135–145)

## 2021-12-22 LAB — CBC
HCT: 44 % (ref 39.0–52.0)
Hemoglobin: 14.5 g/dL (ref 13.0–17.0)
MCH: 28.5 pg (ref 26.0–34.0)
MCHC: 33 g/dL (ref 30.0–36.0)
MCV: 86.4 fL (ref 80.0–100.0)
Platelets: 350 10*3/uL (ref 150–400)
RBC: 5.09 MIL/uL (ref 4.22–5.81)
RDW: 12.7 % (ref 11.5–15.5)
WBC: 10.7 10*3/uL — ABNORMAL HIGH (ref 4.0–10.5)
nRBC: 0 % (ref 0.0–0.2)

## 2021-12-22 LAB — MAGNESIUM: Magnesium: 2.5 mg/dL — ABNORMAL HIGH (ref 1.7–2.4)

## 2021-12-22 LAB — TROPONIN I (HIGH SENSITIVITY)
Troponin I (High Sensitivity): 6 ng/L (ref <18)
Troponin I (High Sensitivity): 5 ng/L (ref <18)

## 2021-12-22 LAB — TSH: TSH: 2.287 u[IU]/mL (ref 0.350–4.500)

## 2021-12-22 LAB — D-DIMER, QUANTITATIVE: D-Dimer, Quant: <0.27 ug/mL-FEU (ref 0.00–0.50)

## 2021-12-22 NOTE — ED Notes (Signed)
Pt reports no concerns feels safe for DC.

## 2021-12-22 NOTE — ED Notes (Signed)
Pt brought to ER 10 at this time, this RN now assuming care.

## 2021-12-22 NOTE — ED Notes (Signed)
Lab called to add mag and TSH

## 2021-12-22 NOTE — ED Triage Notes (Signed)
Pt arrived via POV with reports of rapid heart beat that woke him from his sleep, pt also reports some shortness of breath on exertion.  Pt denies any pain at this time, has hx of HTN and currently on meds.

## 2021-12-22 NOTE — ED Notes (Signed)
ED Provider at bedside. 

## 2021-12-22 NOTE — Discharge Instructions (Signed)
Avoid caffeine whenever possible.  Return to the ER for worsening symptoms, persistent vomiting, difficulty breathing or other concerns. 

## 2021-12-22 NOTE — ED Provider Notes (Signed)
[facility] Provider Note    Event Date/Time   First MD Initiated Contact with Patient 12/22/21 [phone]     (approximate)   History   Tachycardia   HPI  Matthew Wu is a 26 y.o. male  who presents to the ED from home with a chief complaint of palpitations. Awoke from sleep with rapid heart rate and some shortness of breath on exertion.  Denies chest pain, nausea/vomiting, diaphoresis or dizziness.  Denies any changes in diet or medications.  Takes blood pressure medications and reports compliance.  Drinks a soda every other day and sweet tea sparingly.  Does not drink coffee or energy drinks.  Had a car trip to [place] at the beginning of the month.  Denies recent trauma or hormone use     Past Medical History   Past Medical History:  Diagnosis Date   Asthma      Active Problem List   Patient Active Problem List   Diagnosis Date Noted   Obesity (BMI 30.0-34.9) 05/07/2020   Hypertension 07/15/2018   Anxiety 07/15/2018   Dizziness, nonspecific 07/15/2018   Acid reflux 07/15/2018   Vitamin D deficiency 06/01/2015   Hyperlipidemia 06/01/2015     Past Surgical History  History reviewed. No pertinent surgical history.   Home Medications   Prior to Admission medications   Medication Sig Start Date End Date Taking? Authorizing Provider  amLODipine (NORVASC) 10 MG tablet TAKE ONE TABLET BY MOUTH EVERY EVENING FOR BLOOD PRESSURE 12/09/21   Adela Glimpse, NP  Calcium Carbonate Antacid (TUMS PO) Take by mouth as needed.    [provider]  fluticasone (FLONASE) 50 MCG/ACT nasal spray Place 2 sprays into both nostrils daily. 02/21/21 02/21/22  Evon Slack, PA-C  hydrochlorothiazide (HYDRODIURIL) 25 MG tablet Take 1 tab daily in the morning for blood pressure goal < 130/70 08/08/20   Judd Gaudier, NP  omeprazole (PRILOSEC) 20 MG capsule Take 1 cap 30 min prior to breakfast and prior to bedtime for 8 weeks, then try reducing to once  daily. 11/20/21   Raynelle Dick, NP     Allergies  Gramineae pollens, Pollen extract, and Robitussin (alcohol free) [guaifenesin]   Family History   Family History  Problem Relation Age of Onset   Hypertension Other      Physical Exam  Triage Vital Signs: ED Triage Vitals  Enc Vitals Group     BP 12/22/21 0041 (!) 154/87     Pulse Rate 12/22/21 0041 96     Resp 12/22/21 0041 16     Temp 12/22/21 0041 98.4 F (36.9 C)     Temp Source 12/22/21 0041 Oral     SpO2 12/22/21 0041 98 %     Weight 12/22/21 0038 220 lb (99.8 kg)     Height 12/22/21 0038 5\' 9"  (1.753 m)     Head Circumference --      Peak Flow --      Pain Score 12/22/21 0038 0     Pain Loc --      Pain Edu? --      Excl. in GC? --     Updated Vital Signs: BP 139/81   Pulse 68   Temp 98.4 F (36.9 C) (Oral)   Resp 17   Ht 5\' 9"  (1.753 m)   Wt 99.8 kg   SpO2 96%   BMI 32.49 kg/m    General: Awake, no distress.  CV:  RRR.  Good peripheral perfusion.  Resp:  Normal effort.  CTA B. Abd:  Nontender.  No distention.  Other:  No thyromegaly.  Bilateral calves are nonswollen and nontender.   ED Results / Procedures / Treatments  Labs (all labs ordered are listed, but only abnormal results are displayed) Labs Reviewed  BASIC METABOLIC PANEL - Abnormal; Notable for the following components:      Result Value   Glucose, Bld 152 (*)    All other components within normal limits  CBC - Abnormal; Notable for the following components:   WBC 10.7 (*)    All other components within normal limits  MAGNESIUM - Abnormal; Notable for the following components:   Magnesium 2.5 (*)    All other components within normal limits  TSH  D-DIMER, QUANTITATIVE  TROPONIN I (HIGH SENSITIVITY)  TROPONIN I (HIGH SENSITIVITY)     EKG  ED ECG REPORT I, Hildy Nicholl J, the attending physician, personally viewed and interpreted this ECG.   Date: 12/22/2021  EKG Time: 0038  Rate: 78  Rhythm: normal sinus rhythm   Axis: Normal  Intervals:none  ST&T Change: Nonspecific    RADIOLOGY I have independently visualized and interpreted patient's chest x-ray as well as noted the radiology interpretation:  Chest x-ray: No acute cardiopulmonary process  Official radiology report(s): DG Chest 2 View  Result Date: 12/22/2021 CLINICAL DATA:  Tachycardia EXAM: CHEST - 2 VIEW COMPARISON:  02/21/2021 FINDINGS: The heart size and mediastinal contours are within normal limits. Both lungs are clear. The visualized skeletal structures are unremarkable. IMPRESSION: No active cardiopulmonary disease. Electronically Signed   By: Charlett Nose M.D.   On: 12/22/2021 01:18     PROCEDURES:  Critical Care performed: No  .1-3 Lead EKG Interpretation  Performed by: Irean Hong, MD Authorized by: Irean Hong, MD     Interpretation: normal     ECG rate:  68   ECG rate assessment: normal     Rhythm: sinus rhythm     Ectopy: none     Conduction: normal   Comments:     Patient placed on cardiac monitor to evaluate for arrhythmias    MEDICATIONS ORDERED IN ED: Medications - No data to display   IMPRESSION / MDM / ASSESSMENT AND PLAN / ED COURSE  I reviewed the triage vital signs and the nursing notes.                             26 year old male presenting with palpitations. Differential diagnosis includes, but is not limited to, ACS, aortic dissection, pulmonary embolism, cardiac tamponade, pneumothorax, pneumonia, pericarditis, myocarditis, GI-related causes including esophagitis/gastritis, and musculoskeletal chest wall pain.   I have personally reviewed patient's records and note a GI office visit on 08/08/2021 for diarrhea and GERD.  Patient's presentation is most consistent with acute illness / injury with system symptoms.  The patient is on the cardiac monitor to evaluate for evidence of arrhythmia and/or significant heart rate changes.  Laboratory results demonstrate WBC 10.7, normal electrolytes, initial  troponin and TSH normal.  Chest x-ray unremarkable.  Will check repeat troponin, add D-dimer.  Will reassess.  Clinical Course as of 12/22/21 2620  Wynelle Link Dec 22, 2021  3559 Care transferred to Dr. Sidney Ace at change of shift pending Ddimer and repeat troponin.  Anticipate discharge home if unremarkable. [JS]    Clinical Course User Index [JS] Irean Hong, MD     FINAL CLINICAL IMPRESSION(S) / ED DIAGNOSES  Final diagnoses:  Palpitations     Rx / DC Orders   ED Discharge Orders          Ordered    Ambulatory referral to Cardiology       Comments: If you have not heard from the Cardiology office within the next 72 hours please call [phone].   12/22/21 4403             Note:  This document was prepared using Dragon voice recognition software and may include unintentional dictation errors.   Irean Hong, MD 12/22/21 [phone]

## 2021-12-24 ENCOUNTER — Other Ambulatory Visit: Payer: Self-pay | Admitting: Nurse Practitioner

## 2021-12-24 DIAGNOSIS — I10 Essential (primary) hypertension: Secondary | ICD-10-CM

## 2021-12-24 NOTE — Telephone Encounter (Signed)
He needs to schedule an appointment or can get no further refills.  I believe he was told this last month

## 2021-12-27 ENCOUNTER — Other Ambulatory Visit: Payer: Self-pay | Admitting: Internal Medicine

## 2021-12-27 ENCOUNTER — Ambulatory Visit: Payer: Self-pay

## 2021-12-27 ENCOUNTER — Other Ambulatory Visit: Payer: Self-pay | Admitting: Nurse Practitioner

## 2021-12-27 DIAGNOSIS — K219 Gastro-esophageal reflux disease without esophagitis: Secondary | ICD-10-CM

## 2021-12-27 DIAGNOSIS — I10 Essential (primary) hypertension: Secondary | ICD-10-CM

## 2021-12-27 MED ORDER — OMEPRAZOLE 20 MG PO CPDR: DELAYED_RELEASE_CAPSULE | ORAL | 1 refills | Status: DC

## 2021-12-27 MED ORDER — AMLODIPINE BESYLATE 10 MG PO TABS: ORAL_TABLET | ORAL | 1 refills | Status: DC

## 2021-12-27 MED ORDER — AMLODIPINE BESYLATE 10 MG PO TABS: ORAL_TABLET | ORAL | 0 refills | Status: DC

## 2021-12-27 NOTE — Telephone Encounter (Signed)
Pt is calling to ask that he has 2 blood pressure pills left. Pt was dismissed from [facility]. Pt has no insurance and has a new patient appt in November.Pt would like to know how he can get his medication until [facility] appt     Pt. Currently has no PCP. Needs refills on medications. Instructed to go to UC/ED or could try [facility]. Answer Assessment - Initial Assessment Questions 1. DRUG NAME: "What medicine do you need to have refilled?"     Amlodipine,HCTZ 2. REFILLS REMAINING: "How many refills are remaining?" (Note: The label on the medicine or pill bottle will show how many refills are remaining. If there are no refills remaining, then a renewal may be needed.)     0 3. EXPIRATION DATE: "What is the expiration date?" (Note: The label states when the prescription will expire, and thus can no longer be refilled.)     N/A 4. PRESCRIBING HCP: "Who prescribed it?" Reason: If prescribed by specialist, call should be referred to that group.     N/A 5. SYMPTOMS: "Do you have any symptoms?"     N/A 6. PREGNANCY: "Is there any chance that you are pregnant?" "When was your last menstrual period?"     N/A  Protocols used: Medication Refill and Renewal Call-A-AH

## 2022-04-07 ENCOUNTER — Ambulatory Visit: Payer: Self-pay | Admitting: Physician Assistant

## 2022-04-07 DIAGNOSIS — K219 Gastro-esophageal reflux disease without esophagitis: Secondary | ICD-10-CM

## 2022-04-07 DIAGNOSIS — E669 Obesity, unspecified: Secondary | ICD-10-CM

## 2022-04-07 DIAGNOSIS — E559 Vitamin D deficiency, unspecified: Secondary | ICD-10-CM

## 2022-04-07 DIAGNOSIS — F419 Anxiety disorder, unspecified: Secondary | ICD-10-CM

## 2022-04-07 DIAGNOSIS — Z7689 Persons encountering health services in other specified circumstances: Secondary | ICD-10-CM

## 2022-04-07 DIAGNOSIS — E785 Hyperlipidemia, unspecified: Secondary | ICD-10-CM

## 2022-04-07 DIAGNOSIS — I10 Essential (primary) hypertension: Secondary | ICD-10-CM

## 2022-04-07 NOTE — Progress Notes (Deleted)
I,Matthew Wu,acting as a Neurosurgeon for [employer], PA-C.,have documented all relevant documentation on the behalf of Matthew Lat, PA-C,as directed by  [employer], PA-C while in the presence of [employer], PA-C.   New patient visit   Patient: Matthew Wu   DOB: Nov 22, 1995   26 y.o. Male  MRN: [id number] Visit Date: 04/07/2022  Today's healthcare provider: Debera Lat, PA-C   No chief complaint on file.  Subjective    Matthew Wu is a 26 y.o. male who presents today as a new patient to establish care.  HPI  ***  Past Medical History:  Diagnosis Date   Asthma    No past surgical history on file. Family Status  Relation Name Status   Other  (Not Specified)   Family History  Problem Relation Age of Onset   Hypertension Other    Social History   Socioeconomic History   Marital status: Single    Spouse name: Not on file   Number of children: Not on file   Years of education: Not on file   Highest education level: Not on file  Occupational History   Not on file  Tobacco Use   Smoking status: Never   Smokeless tobacco: Never  Substance and Sexual Activity   Alcohol use: No   Drug use: No   Sexual activity: Not on file  Other Topics Concern   Not on file  Social History Narrative   Not on file   Social Determinants of Health   Financial Resource Strain: Not on file  Food Insecurity: Not on file  Transportation Needs: Not on file  Physical Activity: Not on file  Stress: Not on file  Social Connections: Not on file   Outpatient Medications Prior to Visit  Medication Sig   amLODipine (NORVASC) 10 MG tablet Take 1 tablet  Daily  for BP                                                /                                     TAKE                               BY                         MOUTH   Calcium Carbonate Antacid (TUMS PO) Take by mouth as needed.   fluticasone (FLONASE) 50 MCG/ACT nasal spray Place 2 sprays into both nostrils daily.    hydrochlorothiazide (HYDRODIURIL) 25 MG tablet Take 1 tab daily in the morning for blood pressure goal < 130/70   omeprazole (PRILOSEC) 20 MG capsule Take  1 capsule  2 x /day to Prevent Indigestion & Heartburn   No facility-administered medications prior to visit.   Allergies  Allergen Reactions   Gramineae Pollens     Other reaction(s): eye redness   Pollen Extract     Other reaction(s): eye redness   Robitussin (Alcohol Free) [Guaifenesin] Nausea And Vomiting     There is no immunization history on file for this patient.  Health Maintenance  Topic Date Due  COVID-19 Vaccine (1) Never done   HIV Screening  Never done   Hepatitis C Screening  Never done   INFLUENZA VACCINE  Never done   HPV VACCINES  Aged Out    Patient Care Team: Pcp, No as PCP - General  Review of Systems  Last CBC Lab Results  Component Value Date   WBC 10.7 (H) 12/22/2021   HGB 14.5 12/22/2021   HCT 44.0 12/22/2021   MCV 86.4 12/22/2021   MCH 28.5 12/22/2021   RDW 12.7 12/22/2021   PLT 350 12/22/2021   Last metabolic panel Lab Results  Component Value Date   GLUCOSE 152 (H) 12/22/2021   NA 141 12/22/2021   K 3.8 12/22/2021   CL 108 12/22/2021   CO2 28 12/22/2021   BUN 13 12/22/2021   CREATININE 1.06 12/22/2021   GFRNONAA >60 12/22/2021   CALCIUM 9.1 12/22/2021   PROT 7.2 05/07/2020   BILITOT 0.3 05/07/2020   AST 15 05/07/2020   ALT 32 05/07/2020   ANIONGAP 5 12/22/2021   Last lipids No results found for: "CHOL", "HDL", "LDLCALC", "LDLDIRECT", "TRIG", "CHOLHDL" Last hemoglobin A1c No results found for: "HGBA1C" Last thyroid functions Lab Results  Component Value Date   TSH 2.287 12/22/2021   Last vitamin D No results found for: "25OHVITD2", "25OHVITD3", "VD25OH" Last vitamin B12 and Folate No results found for: "VITAMINB12", "FOLATE"     Objective    There were no vitals taken for this visit. BP Readings from Last 3 Encounters:  12/22/21 139/87  08/08/21 (!) 176/103   02/21/21 (!) 163/93   Wt Readings from Last 3 Encounters:  12/22/21 220 lb (99.8 kg)  08/08/21 220 lb (99.8 kg)  02/21/21 215 lb (97.5 kg)      Physical Exam ***  Depression Screen    07/22/2019    9:08 AM  PHQ 2/9 Scores  PHQ - 2 Score 0  PHQ- 9 Score 0   No results found for any visits on 04/07/22.  Assessment & Plan     ***  No follow-ups on file.     {provider attestation***:1}   Matthew Wu, Cordelia Poche  [facility] [phone] (phone) [phone] (fax)  [facility]

## 2022-09-15 ENCOUNTER — Encounter: Payer: Self-pay | Admitting: *Deleted

## 2022-09-15 ENCOUNTER — Encounter: Payer: Self-pay | Admitting: Nurse Practitioner

## 2022-09-22 ENCOUNTER — Ambulatory Visit: Payer: Self-pay | Attending: Cardiology | Admitting: Cardiology

## 2022-09-23 ENCOUNTER — Encounter: Payer: Self-pay | Admitting: Cardiology

## 2022-12-25 IMAGING — CR DG CHEST 2V
1 series · 2 of 2 positions shown · non-contrast
Comparison: 01/30/2018

CLINICAL DATA: Productive cough for several days, initial encounter

EXAM:
CHEST - 2 VIEW

[Series 1: dg chest 2 view · 0.14mm/px · 2 of 2 slices shown]
[im 1/2]
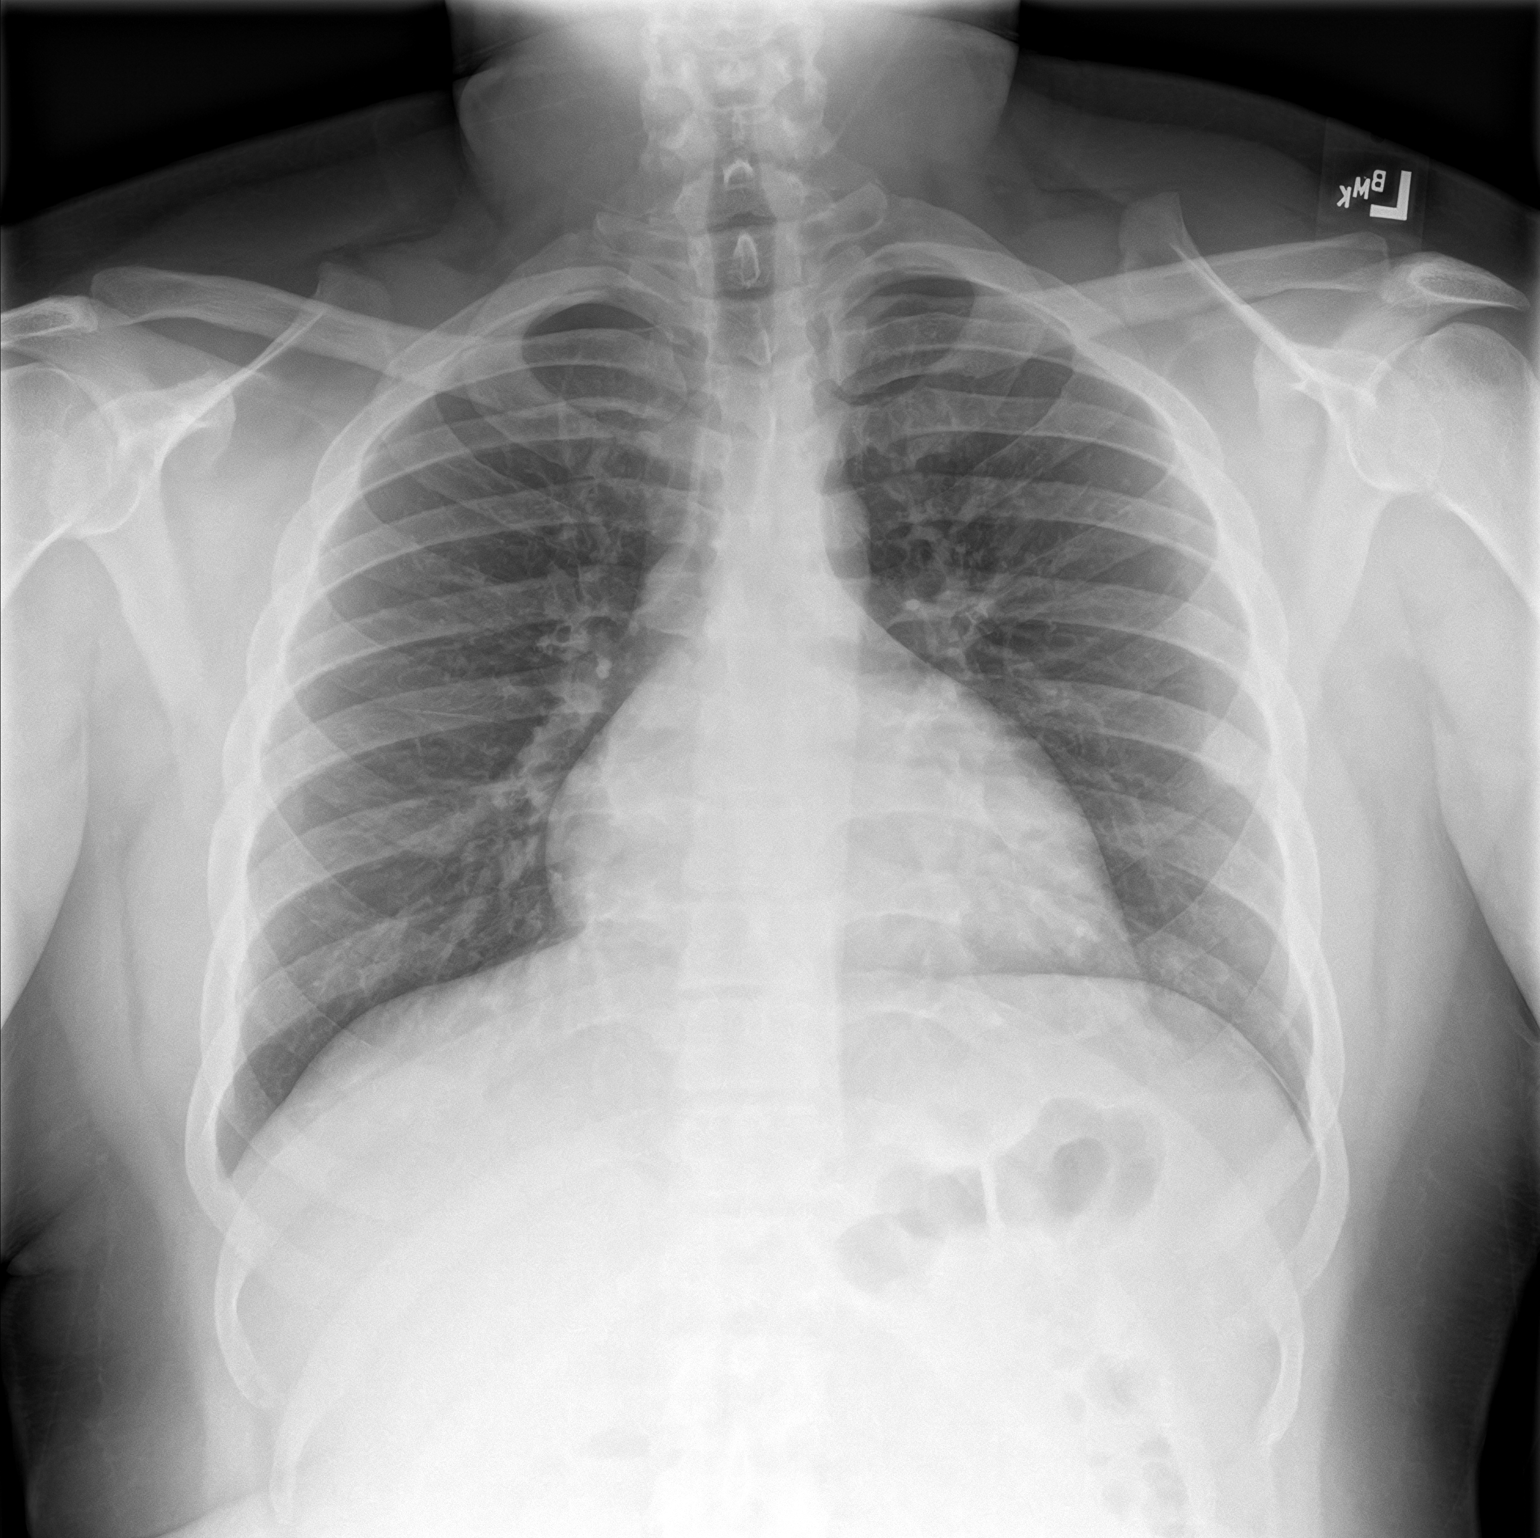
[im 2/2]
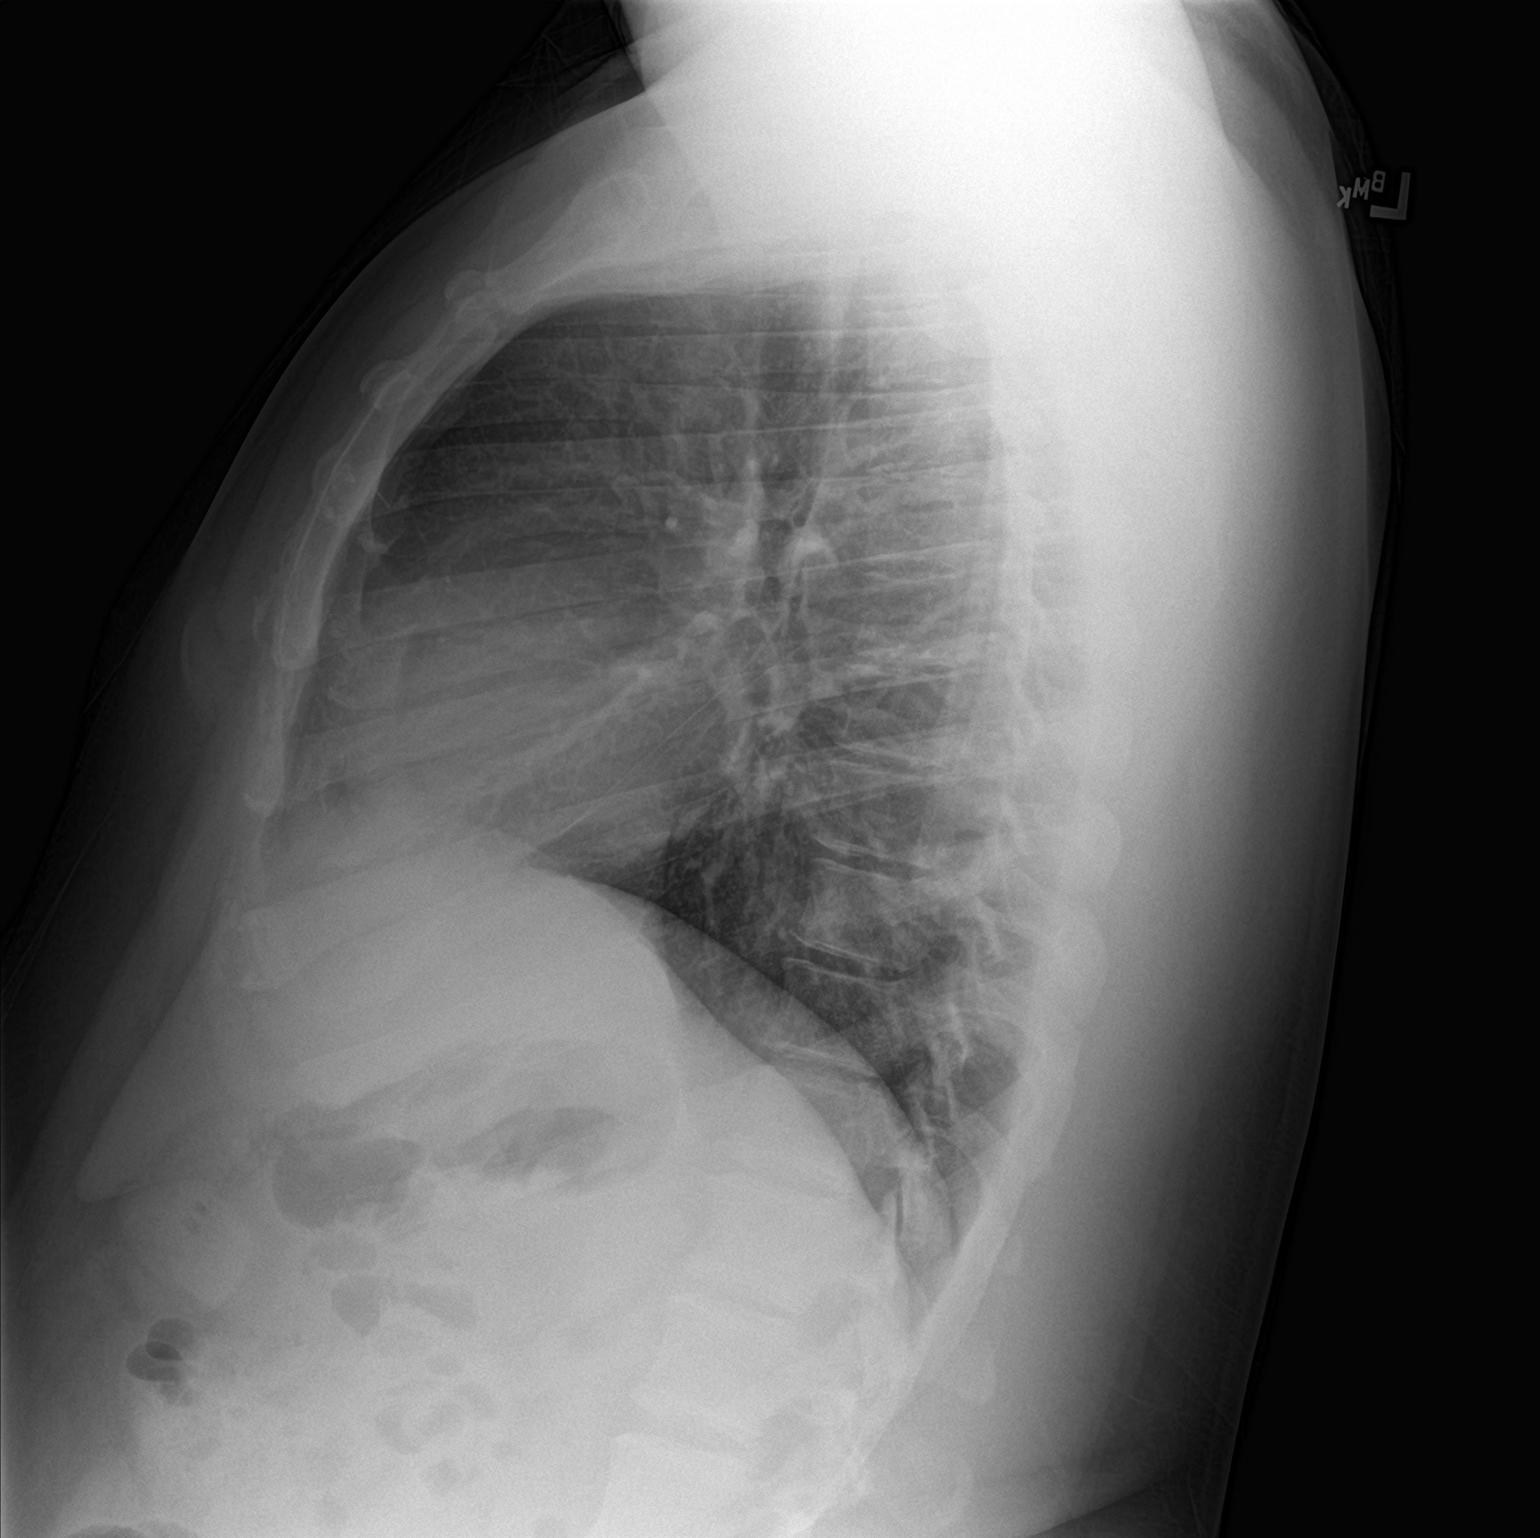

[2 of 2 positions shown; findings below may reference images not displayed]

FINDINGS: The heart size and mediastinal contours are within normal limits.
Both lungs are clear. The visualized skeletal structures are
unremarkable.
IMPRESSION: No active cardiopulmonary disease.

## 2023-01-06 ENCOUNTER — Ambulatory Visit (INDEPENDENT_AMBULATORY_CARE_PROVIDER_SITE_OTHER): Payer: Managed Care, Other (non HMO) | Admitting: Family

## 2023-01-06 VITALS — BP 136/86 | HR 77 | Temp 98.0°F | Ht 69.0 in | Wt 198.6 lb

## 2023-01-06 DIAGNOSIS — I10 Essential (primary) hypertension: Secondary | ICD-10-CM

## 2023-01-06 DIAGNOSIS — K580 Irritable bowel syndrome with diarrhea: Secondary | ICD-10-CM

## 2023-01-06 DIAGNOSIS — Z23 Encounter for immunization: Secondary | ICD-10-CM

## 2023-01-06 DIAGNOSIS — E663 Overweight: Secondary | ICD-10-CM | POA: Diagnosis not present

## 2023-01-06 DIAGNOSIS — D72829 Elevated white blood cell count, unspecified: Secondary | ICD-10-CM | POA: Diagnosis not present

## 2023-01-06 DIAGNOSIS — K2100 Gastro-esophageal reflux disease with esophagitis, without bleeding: Secondary | ICD-10-CM

## 2023-01-06 DIAGNOSIS — R739 Hyperglycemia, unspecified: Secondary | ICD-10-CM

## 2023-01-06 DIAGNOSIS — R1013 Epigastric pain: Secondary | ICD-10-CM | POA: Diagnosis not present

## 2023-01-06 DIAGNOSIS — R1010 Upper abdominal pain, unspecified: Secondary | ICD-10-CM

## 2023-01-06 LAB — CBC WITH DIFFERENTIAL/PLATELET
Basophils Absolute: 0 10*3/uL (ref 0.0–0.1)
Basophils Relative: 0.6 % (ref 0.0–3.0)
Eosinophils Absolute: 0.1 10*3/uL (ref 0.0–0.7)
Eosinophils Relative: 1.7 % (ref 0.0–5.0)
HCT: 46.2 % (ref 39.0–52.0)
Hemoglobin: 15 g/dL (ref 13.0–17.0)
Lymphocytes Relative: 35.5 % (ref 12.0–46.0)
Lymphs Abs: 2.4 10*3/uL (ref 0.7–4.0)
MCHC: 32.5 g/dL (ref 30.0–36.0)
MCV: 88.6 fl (ref 78.0–100.0)
Monocytes Absolute: 0.6 10*3/uL (ref 0.1–1.0)
Monocytes Relative: 8.3 % (ref 3.0–12.0)
Neutro Abs: 3.7 10*3/uL (ref 1.4–7.7)
Neutrophils Relative %: 53.9 % (ref 43.0–77.0)
Platelets: 336 10*3/uL (ref 150.0–400.0)
RBC: 5.22 Mil/uL (ref 4.22–5.81)
RDW: 13 % (ref 11.5–15.5)
WBC: 6.9 10*3/uL (ref 4.0–10.5)

## 2023-01-06 LAB — BASIC METABOLIC PANEL
BUN: 12 mg/dL (ref 6–23)
CO2: 30 mEq/L (ref 19–32)
Calcium: 9.5 mg/dL (ref 8.4–10.5)
Chloride: 102 mEq/L (ref 96–112)
Creatinine, Ser: 0.9 mg/dL (ref 0.40–1.50)
GFR: 116.22 mL/min (ref >60.00)
Glucose, Bld: 85 mg/dL (ref 70–99)
Potassium: 3.7 mEq/L (ref 3.5–5.1)
Sodium: 139 mEq/L (ref 135–145)

## 2023-01-06 LAB — MAGNESIUM: Magnesium: 2 mg/dL (ref 1.5–2.5)

## 2023-01-06 LAB — HEMOGLOBIN A1C: Hgb A1c MFr Bld: 5.4 % (ref 4.6–6.5)

## 2023-01-06 LAB — LIPASE: Lipase: 14 U/L (ref 11.0–59.0)

## 2023-01-06 LAB — AMYLASE: Amylase: 46 U/L (ref 27–131)

## 2023-01-06 NOTE — Assessment & Plan Note (Signed)
Continue pantoprazole 40 mg twice daily  Try to decrease and or avoid spicy foods, fried fatty foods, and also caffeine and chocolate as these can increase heartburn symptoms.  Ongoing, not improving, advised pt to call and schedule appt with Dr. Annabell Sabal office to consider eval/treat possible endoscopy

## 2023-01-06 NOTE — Progress Notes (Signed)
New Patient Office Visit  Subjective:  Patient ID: Matthew Wu, male    DOB: 01/14/1996  Age: 27 y.o. MRN: [id number]  CC:  Chief Complaint  Patient presents with   Establish Care    Pt here to Est care and also would like a referral to [facility]. He has been having trouble with food going down and getting stuck in the top part of his stomach along with some cramping     HPI Matthew Wu is here to establish care as a new patient.  Oriented to practice routines and expectations.  Prior provider was: Dr. Darl Pikes ziglar   Pt is with acute concerns.   Ongoing heartburn concerns. If he eats bacon has increased indigestion. He feels this has started all the way back to 2019. States he had gotten sick at that time and had some motion sickness and ever since then he has the heartburn. He does state typically with diarrhea, which comes and goes. Sometimes out of the blue and or food related. He does state the stool might have mucous and greasy at times. Does report abdominal pain in the mid upper abdomen. Has felt cramping in the upper quadrant and then will radiate to lower abdominal quadrants. He states he will feel fullness sometimes after eating and at other times no relation to eating. He states the symptoms have improved within the last one month. He does state was tested for h pylori within the last one year and was negative (this was about one month ago per his report)  Calprotectin 08/23/22 negative.  Celiac panel was also negative 08/09/22  Has seen Dr. Servando Snare , gastro in the past , last visit 07/2021 has not been back since however still with same symptoms if not worse since last seeing him.    chronic concerns:  GERD: taking tums and also mylanta, having to take daily. Also seen yesterday with his prior PCP and was changed from omeprazole to pantoprazole 40 mg twice daily. He has never had an Endoscopy, he is requesting referral to Gi. He has cut out spicy and fried fatty foods. Only  drinks green tea.   HTN: taking amlodipine 10 mg once daily as well as hydrochlorothiazide 25 mg once daily. Doing well. Stable today with BP 136/86  Tetanus last at age 61    ROS: Negative unless specifically indicated above in HPI.   Current Outpatient Medications:    amLODipine (NORVASC) 10 MG tablet, Take 1 tablet  Daily  for BP                                                /                                     TAKE                               BY                         MOUTH, Disp: 90 tablet, Rfl: 1   Calcium Carbonate Antacid (TUMS PO), Take by mouth as needed., Disp: , Rfl:    hydrochlorothiazide (HYDRODIURIL) 25 MG tablet, Take 1  tab daily in the morning for blood pressure goal < 130/70, Disp: 90 tablet, Rfl: 1   pantoprazole (PROTONIX) 40 MG tablet, Take 40 mg by mouth 2 (two) times daily., Disp: , Rfl:  Past Medical History:  Diagnosis Date   Asthma    GERD (gastroesophageal reflux disease)    Hyperlipidemia    Hypertension    Past Surgical History:  Procedure Laterality Date   NO PAST SURGERIES      Objective:   Today's Vitals: BP 136/86   Pulse 77   Temp 98 F (36.7 C)   Ht 5\' 9"  (1.753 m)   Wt 198 lb 9.6 oz (90.1 kg)   SpO2 98%   BMI 29.33 kg/m   Physical Exam Cardiovascular:     Rate and Rhythm: Normal rate and regular rhythm.     Heart sounds: Normal heart sounds.  Abdominal:     General: Bowel sounds are normal.     Palpations: Abdomen is soft.     Tenderness: There is abdominal tenderness in the epigastric area and left upper quadrant. There is no guarding. Negative signs include Murphy's sign and McBurney's sign.     Comments: Mild ruq abdominal pain  Abdominal distension mild   Musculoskeletal:     Right lower leg: No edema.     Left lower leg: No edema.     Assessment & Plan:  Primary hypertension -     Basic metabolic panel  Gastroesophageal reflux disease with esophagitis without hemorrhage Assessment & Plan: Continue pantoprazole  40 mg twice daily  Try to decrease and or avoid spicy foods, fried fatty foods, and also caffeine and chocolate as these can increase heartburn symptoms.  Ongoing, not improving, advised pt to call and schedule appt with Dr. Annabell Sabal office to consider eval/treat possible endoscopy   Orders: -     Alpha-Gal Panel  Overweight (BMI 25.0-29.9)  Irritable bowel syndrome with diarrhea  Hyperglycemia -     Hemoglobin A1c  Epigastric pain -     Alpha-Gal Panel -     CBC with Differential/Platelet -     Basic metabolic panel  High magnesium levels -     Magnesium  Leukocytosis, unspecified type -     CBC with Differential/Platelet  Upper abdominal pain -     US Abdomen Complete; Future -     Lipase -     Amylase  Need for Tdap vaccination -     Tdap vaccine greater than or equal to 7yo IM  Other orders -     Interpretation:    Follow-up: Return in about 2 weeks (around 01/20/2023) for f/u abdominal pain.   Mort Sawyers, FNP

## 2023-01-06 NOTE — Patient Instructions (Addendum)
------------------------------------   Call Dr. Vilma Prader office to make appointment with your GI doctor.   [phone]   ------------------------------------  I have sent in your order electronically for the following: ultrasound abdomen  at this location below. Please call to schedule the appointment at your convenience  [facility] outpatient imaging center off [address], [place] [address] Phone [phone]  8-5 pm    ------------------------------------

## 2023-01-07 ENCOUNTER — Ambulatory Visit
Admission: RE | Admit: 2023-01-07 | Discharge: 2023-01-07 | Disposition: A | Discharge status: Home / Self Care | Payer: Managed Care, Other (non HMO) | Source: Ambulatory Visit | Attending: Family | Admitting: Family

## 2023-01-07 DIAGNOSIS — R1010 Upper abdominal pain, unspecified: Secondary | ICD-10-CM | POA: Diagnosis present

## 2023-01-09 ENCOUNTER — Encounter: Payer: Self-pay | Admitting: Family

## 2023-01-09 DIAGNOSIS — Z8639 Personal history of other endocrine, nutritional and metabolic disease: Secondary | ICD-10-CM

## 2023-01-09 DIAGNOSIS — R1010 Upper abdominal pain, unspecified: Secondary | ICD-10-CM

## 2023-01-09 DIAGNOSIS — K2100 Gastro-esophageal reflux disease with esophagitis, without bleeding: Secondary | ICD-10-CM

## 2023-01-09 DIAGNOSIS — R143 Flatulence: Secondary | ICD-10-CM

## 2023-01-09 DIAGNOSIS — R140 Abdominal distension (gaseous): Secondary | ICD-10-CM

## 2023-01-09 DIAGNOSIS — R1013 Epigastric pain: Secondary | ICD-10-CM

## 2023-01-11 LAB — ALPHA-GAL PANEL
Allergen, Mutton, f88: <0.1 kU/L
Allergen, Pork, f26: <0.1 kU/L
Beef: <0.1 kU/L
CLASS: 0
CLASS: 0
Class: 0
GALACTOSE-ALPHA-1,3-GALACTOSE IGE*: <0.1 kU/L (ref <0.10)

## 2023-01-11 LAB — INTERPRETATION:

## 2023-01-13 ENCOUNTER — Encounter: Payer: Self-pay | Admitting: Family

## 2023-01-13 DIAGNOSIS — R140 Abdominal distension (gaseous): Secondary | ICD-10-CM

## 2023-01-13 DIAGNOSIS — R1010 Upper abdominal pain, unspecified: Secondary | ICD-10-CM

## 2023-01-13 DIAGNOSIS — R1013 Epigastric pain: Secondary | ICD-10-CM

## 2023-01-13 DIAGNOSIS — K2100 Gastro-esophageal reflux disease with esophagitis, without bleeding: Secondary | ICD-10-CM

## 2023-01-15 NOTE — Telephone Encounter (Signed)
Perfect thank you!

## 2023-01-15 NOTE — Telephone Encounter (Signed)
I spoke with [facility] Scheduling, he is scheduled on 01/21/23 @ [facility]  He said his pain was ok at this time

## 2023-01-15 NOTE — Addendum Note (Signed)
Addended by: Mort Sawyers on: 01/15/2023 03:20 PM   Modules accepted: Orders

## 2023-01-15 NOTE — Telephone Encounter (Signed)
Stat hida scan.  Just making you aware. Pt not in office but aware I am ordering.

## 2023-01-21 ENCOUNTER — Encounter
Admission: RE | Admit: 2023-01-21 | Discharge: 2023-01-21 | Disposition: A | Discharge status: Home / Self Care | Payer: Managed Care, Other (non HMO) | Source: Ambulatory Visit | Attending: Family | Admitting: Family

## 2023-01-21 ENCOUNTER — Other Ambulatory Visit: Payer: Self-pay | Admitting: Family

## 2023-01-21 DIAGNOSIS — R1013 Epigastric pain: Secondary | ICD-10-CM

## 2023-01-21 DIAGNOSIS — R1010 Upper abdominal pain, unspecified: Secondary | ICD-10-CM | POA: Insufficient documentation

## 2023-01-21 DIAGNOSIS — K2100 Gastro-esophageal reflux disease with esophagitis, without bleeding: Secondary | ICD-10-CM

## 2023-01-21 DIAGNOSIS — R140 Abdominal distension (gaseous): Secondary | ICD-10-CM | POA: Insufficient documentation

## 2023-01-21 DIAGNOSIS — K580 Irritable bowel syndrome with diarrhea: Secondary | ICD-10-CM

## 2023-01-21 MED ORDER — TECHNETIUM TC 99M MEBROFENIN IV KIT
5.3800 | PACK | Freq: Once | INTRAVENOUS | Status: AC | PRN
  Administered 2023-01-21: 5.38 via INTRAVENOUS

## 2023-01-22 ENCOUNTER — Encounter: Payer: Self-pay | Admitting: *Deleted

## 2023-01-22 NOTE — Telephone Encounter (Signed)
Called Dr. Annabell Sabal office and spoke with Misty Stanley. Pt has been scheduled for 02/18/2023 at 1015 and also added to their cancellation list.  Received message from referral coordinator that the CT is under insurance review and it could be tomorrow or Monday before an approval could be received. Message will be sent to pt through MyChart with this information.

## 2023-01-22 NOTE — Telephone Encounter (Signed)
Thanks Ashtyn. Noted.

## 2023-01-22 NOTE — Telephone Encounter (Signed)
Would it be possible to call Dr. Annabell Sabal office (GI)  to see if we can get him on a cancellation or same day slot with the GI as his symptoms are dramatically worsening? He is established with him already.  I also ordered a lab for h pylori that I would like for pt to get done asap.   I am ordering a stat Ct abd pelvis , they should actually give him a call today to get this moving. If any worsening pain and or unable to tolerate food eating and or drinking and or starts vomiting or with extreme constipation needs to go to Er.

## 2023-01-23 NOTE — Telephone Encounter (Signed)
Pt is scheduled for Monday 01/26/23 for the CT Abdomen Pelvis.   Pt requested Monday rather than today.

## 2023-01-26 ENCOUNTER — Ambulatory Visit
Admission: RE | Admit: 2023-01-26 | Discharge: 2023-01-26 | Disposition: A | Discharge status: Home / Self Care | Payer: Managed Care, Other (non HMO) | Source: Ambulatory Visit | Attending: Family | Admitting: Family

## 2023-01-26 ENCOUNTER — Other Ambulatory Visit: Payer: Managed Care, Other (non HMO)

## 2023-01-26 DIAGNOSIS — K2100 Gastro-esophageal reflux disease with esophagitis, without bleeding: Secondary | ICD-10-CM

## 2023-01-26 DIAGNOSIS — R1010 Upper abdominal pain, unspecified: Secondary | ICD-10-CM

## 2023-01-26 DIAGNOSIS — R1013 Epigastric pain: Secondary | ICD-10-CM

## 2023-01-26 DIAGNOSIS — R143 Flatulence: Secondary | ICD-10-CM

## 2023-01-26 DIAGNOSIS — R140 Abdominal distension (gaseous): Secondary | ICD-10-CM

## 2023-01-26 MED ORDER — IOPAMIDOL (ISOVUE-300) INJECTION 61%
100.0000 mL | Freq: Once | INTRAVENOUS | Status: AC | PRN
  Administered 2023-01-26: 100 mL via INTRAVENOUS

## 2023-01-30 LAB — H. PYLORI BREATH TEST: H. pylori Breath Test: NOT DETECTED

## 2023-02-10 DIAGNOSIS — Z8639 Personal history of other endocrine, nutritional and metabolic disease: Secondary | ICD-10-CM | POA: Insufficient documentation

## 2023-02-12 ENCOUNTER — Other Ambulatory Visit: Payer: Self-pay

## 2023-02-17 NOTE — Progress Notes (Unsigned)
Celso Amy, PA-C [address]  [address]  [place], [place] [address]  Main: [phone]  Fax: [phone]   Gastroenterology Consultation  Referring Provider:     Mort Sawyers, FNP Primary Care Physician:  Mort Sawyers, FNP Primary Gastroenterologist:  Celso Amy, PA-C / Dr. Midge Minium   Reason for Consultation:     GERD        HPI:   Matthew Wu is a 27 y.o. y/o male referred for consultation & management  by Mort Sawyers, FNP.    Patient saw his PCP 01/13/2023 to evaluate GERD, IBS-D, epigastric pain.  Has history of GERD for 5 years.  He has been on PPIs for 5 years which have stopped working.  He was taking pantoprazole 40 mg once daily and having a lot of breakthrough heartburn.  His pantoprazole was increased to 40 Mg twice daily, which has helped, yet still not controlling his GERD.  He has episodes of epigastric pain and acid coming up into his chest and throat.  Feels like food is getting stuck in the epigastrium and not going down well.  Reports increased belching, bloating and nausea.  No vomiting.  Denies weight loss.  Food triggers include spicy, citrus, coffee, caffeine, sodas, and sweets.  He avoids all of these.  No previous EGD.    He saw Dr. Servando Snare 07/2021 for diarrhea and multiple GI symptoms, chronic for many years.  Diagnosed with IBS.  He still has occasional episode of diarrhea which comes and goes.  Denies rectal bleeding.  Denies alcohol, marijuana, or tobacco use.  Lab/stool studies 07/2021: Negative celiac panel, normal fecal calprotectin and fecal fat.  Normal TSH.  Low vitamin D level.  Labs 12/2022: Negative alpha gal, negative H. pylori breath test.  Normal CBC and BMP.  Hemoglobin 15.  Normal lipase, amylase, and A1c.  Complete abdominal ultrasound 12/2022: Hepatic steatosis, otherwise normal.  HIDA scan 01/2023: Normal gallbladder ejection fraction 67%.  Abdominal pelvic CT 01/2023: Normal.  Past Medical History:  Diagnosis Date    Asthma    GERD (gastroesophageal reflux disease)    Hyperlipidemia    Hypertension     Past Surgical History:  Procedure Laterality Date   NO PAST SURGERIES      Prior to Admission medications   Medication Sig Start Date End Date Taking? Authorizing Provider  amLODipine (NORVASC) 10 MG tablet Take 1 tablet  Daily  for BP                                                /                                     TAKE                               BY                         MOUTH 12/27/21   Lucky Cowboy, MD  Calcium Carbonate Antacid (TUMS PO) Take by mouth as needed.    [provider]  hydrochlorothiazide (HYDRODIURIL) 25 MG tablet Take 1 tab daily in the morning for blood pressure goal <  130/70 08/08/20   Judd Gaudier, NP  Multiple Vitamin (MULTIVITAMIN ADULT PO) Take 1 tablet by mouth daily.    [provider]  pantoprazole (PROTONIX) 40 MG tablet Take 40 mg by mouth 2 (two) times daily.    [provider]    Family History  Problem Relation Age of Onset   Stroke Mother    Hypertension Maternal Grandmother    Heart attack Maternal Grandmother 66   Dementia Maternal Grandfather      Social History   Tobacco Use   Smoking status: Never   Smokeless tobacco: Never  Vaping Use   Vaping status: Never Used  Substance Use Topics   Alcohol use: Not Currently   Drug use: No    Allergies as of 02/18/2023 - Review Complete 02/18/2023  Allergen Reaction Noted   Pollen extract  12/22/2021   Robitussin (alcohol free) [guaifenesin] Nausea And Vomiting 05/30/2015    Review of Systems:    All systems reviewed and negative except where noted in HPI.   Physical Exam:  BP 138/86   Pulse 80   Temp 97.8 F (36.6 C)   Ht 5\' 8"  (1.727 m)   Wt 202 lb 9.6 oz (91.9 kg)   BMI 30.81 kg/m  No LMP for male patient.  General:   Alert,  Well-developed, well-nourished, pleasant and cooperative in NAD Lungs:  Respirations even and unlabored.  Clear throughout to  auscultation.   No wheezes, crackles, or rhonchi. No acute distress. Heart:  Regular rate and rhythm; no murmurs, clicks, rubs, or gallops. Abdomen:  Normal bowel sounds.  No bruits.  Soft, and non-distended without masses, hepatosplenomegaly or hernias noted.  No Tenderness.  No guarding or rebound tenderness.    Neurologic:  Alert and oriented x3;  grossly normal neurologically. Psych:  Alert and cooperative. Normal mood and affect.  Imaging Studies: CT ABDOMEN PELVIS W CONTRAST  Result Date: 01/26/2023 CLINICAL DATA:  Upper abdominal pain EXAM: CT ABDOMEN AND PELVIS WITH CONTRAST TECHNIQUE: Multidetector CT imaging of the abdomen and pelvis was performed using the standard protocol following bolus administration of intravenous contrast. RADIATION DOSE REDUCTION: This exam was performed according to the departmental dose-optimization program which includes automated exposure control, adjustment of the mA and/or kV according to patient size and/or use of iterative reconstruction technique. CONTRAST:  ISOVUE-300 IOPAMIDOL (ISOVUE-300) INJECTION 61% COMPARISON:  Hepatobiliary nuclear medicine scan dated 01/31/2023. Abdominal ultrasound dated 01/07/2023. FINDINGS: Lower chest: Lung bases are clear. Hepatobiliary: Liver is within normal limits. Gallbladder is grossly unremarkable. No intrahepatic or extrahepatic ductal dilatation. Pancreas: Within normal limits. Spleen: Within normal limits. Adrenals/Urinary Tract: Adrenal glands are within normal limits. Kidneys are within normal limits.  No hydronephrosis. Bladder is within normal limits. Stomach/Bowel: Stomach is within normal limits. No evidence of bowel obstruction. Normal appendix (series 2/image 6). No colonic wall thickening or inflammatory changes. Vascular/Lymphatic: No evidence of abdominal aortic aneurysm. No suspicious abdominopelvic lymphadenopathy. Reproductive: Prostate is unremarkable. Other: No abdominopelvic ascites. Musculoskeletal:  Visualized osseous structures are within normal limits. IMPRESSION: Normal CT abdomen/pelvis. Electronically Signed   By: Charline Bills M.D.   On: 01/26/2023 12:06   NM Hepato W/EF  Result Date: 01/21/2023 CLINICAL DATA:  Abdominal pain EXAM: NUCLEAR MEDICINE HEPATOBILIARY IMAGING WITH GALLBLADDER EF TECHNIQUE: Sequential images of the abdomen were obtained out to 60 minutes following intravenous administration of radiopharmaceutical. After oral ingestion of Ensure, gallbladder ejection fraction was determined. At 60 min, normal ejection fraction is greater than 33%. RADIOPHARMACEUTICALS:  5.38 mCi  Tc-46m  Choletec IV COMPARISON:  None Available. FINDINGS: Prompt uptake and biliary excretion of activity by the liver is seen. Gallbladder activity is visualized, consistent with patency of cystic duct. Biliary activity passes into small bowel, consistent with patent common bile duct. Calculated gallbladder ejection fraction is 67%. (Normal gallbladder ejection fraction with Ensure is greater than 33%.) IMPRESSION: No common duct or cystic duct obstruction. Normal gallbladder ejection fraction of 67% Electronically Signed   By: Karen Kays M.D.   On: 01/21/2023 11:56    Assessment and Plan:   Akeem Heppler is a 27 y.o. y/o male has been referred for chronic persistent worsening GERD, epigastric pain, and dysphagia on PPI for many years.  Scheduling EGD for further evaluation.  He has had negative test for celiac, H. pylori, and alpha gal.  He also has IBS-D.  Discussed treatment at length.  Epigastric Pain  Scheduling EGD I discussed risks of EGD with patient to include risk of bleeding, perforation, and risk of sedation.  Patient expressed understanding and agrees to proceed with EGD.   Dysphagia EGD with possible dilation  GERD -improved yet not controlled on high-dose PPI Decrease pantoprazole 40 Mg to 1 tablet once daily 30 minutes before breakfast. Take famotidine 20 Mg 1 or 2 tablets once  daily before dinner or bedtime. Take Tums as needed for breakthrough GERD symptoms.  Recommend Lifestyle Modifications to prevent Acid Reflux.  Rec. Avoid coffee, sodas, peppermint, citrus fruits, and spicey foods.  Avoid eating 2-3 hours before bedtime.   We discussed adverse side effects of PPIs to include vitamin deficiencies, osteoporosis, renal insufficiency, dementia and increased risk of C. Difficile.  Recommend take lowest effective dose of PPI necessary to control acid reflux.  OK to add H2RB (Pepcid 20mg  daily) or antiacid if needed for breakthrough acid reflux.   IBS-D  Rx dicyclomine 10 Mg 3 times daily.  Abdominal cramping.  Low FODMAP diet handout given.  Generalized abdominal pain Labs food allergy panel  Fatty Liver  Lab hepatic panel  Recommend a low-fat diet, regular exercise, and weight loss. Patient education handout about fatty liver disease was given and discussed from up-to-date.   Vit D deficiency  Lab vitamin D  Follow up in 3 months with TG.  Celso Amy, PA-C

## 2023-02-18 ENCOUNTER — Ambulatory Visit: Payer: Managed Care, Other (non HMO) | Admitting: Physician Assistant

## 2023-02-18 ENCOUNTER — Encounter: Payer: Self-pay | Admitting: Physician Assistant

## 2023-02-18 ENCOUNTER — Other Ambulatory Visit: Payer: Self-pay | Admitting: *Deleted

## 2023-02-18 VITALS — BP 138/86 | HR 80 | Temp 97.8°F | Ht 68.0 in | Wt 202.6 lb

## 2023-02-18 DIAGNOSIS — K219 Gastro-esophageal reflux disease without esophagitis: Secondary | ICD-10-CM | POA: Diagnosis not present

## 2023-02-18 DIAGNOSIS — R1310 Dysphagia, unspecified: Secondary | ICD-10-CM | POA: Diagnosis not present

## 2023-02-18 DIAGNOSIS — E559 Vitamin D deficiency, unspecified: Secondary | ICD-10-CM

## 2023-02-18 DIAGNOSIS — R1013 Epigastric pain: Secondary | ICD-10-CM

## 2023-02-18 DIAGNOSIS — K760 Fatty (change of) liver, not elsewhere classified: Secondary | ICD-10-CM

## 2023-02-18 DIAGNOSIS — K580 Irritable bowel syndrome with diarrhea: Secondary | ICD-10-CM | POA: Diagnosis not present

## 2023-02-18 DIAGNOSIS — R1084 Generalized abdominal pain: Secondary | ICD-10-CM

## 2023-02-18 MED ORDER — FAMOTIDINE 20 MG PO TABS
20.0000 mg | ORAL_TABLET | Freq: Two times a day (BID) | ORAL | Status: AC
Start: 2023-02-18 — End: ?

## 2023-02-18 MED ORDER — DICYCLOMINE HCL 10 MG PO CAPS
10.0000 mg | ORAL_CAPSULE | Freq: Three times a day (TID) | ORAL | 2 refills | Status: DC
Start: 2023-02-18 — End: 2023-09-23

## 2023-02-19 ENCOUNTER — Other Ambulatory Visit: Payer: Self-pay | Admitting: Family

## 2023-02-19 DIAGNOSIS — E559 Vitamin D deficiency, unspecified: Secondary | ICD-10-CM

## 2023-02-19 MED ORDER — CHOLECALCIFEROL 1.25 MG (50000 UT) PO TABS
1.0000 | ORAL_TABLET | ORAL | 0 refills | Status: DC
Start: 2023-02-19 — End: 2023-09-23

## 2023-02-20 LAB — FOOD ALLERGY PROFILE
Allergen Corn, IgE: <0.1 kU/L
Clam IgE: <0.1 kU/L
Codfish IgE: <0.1 kU/L
Egg White IgE: <0.1 kU/L
Milk IgE: <0.1 kU/L
Peanut IgE: <0.1 kU/L
Scallop IgE: <0.1 kU/L
Sesame Seed IgE: <0.1 kU/L
Shrimp IgE: <0.1 kU/L
Soybean IgE: <0.1 kU/L
Walnut IgE: <0.1 kU/L
Wheat IgE: <0.1 kU/L

## 2023-02-20 LAB — HEPATIC FUNCTION PANEL
ALT: 49 [IU]/L — ABNORMAL HIGH (ref 0–44)
AST: 22 [IU]/L (ref 0–40)
Albumin: 4.7 g/dL (ref 4.3–5.2)
Alkaline Phosphatase: 119 [IU]/L (ref 44–121)
Bilirubin Total: 0.6 mg/dL (ref 0.0–1.2)
Bilirubin, Direct: 0.15 mg/dL (ref 0.00–0.40)
Total Protein: 7.9 g/dL (ref 6.0–8.5)

## 2023-02-20 LAB — VITAMIN D 25 HYDROXY (VIT D DEFICIENCY, FRACTURES): Vit D, 25-Hydroxy: 24.3 ng/mL — ABNORMAL LOW (ref 30.0–100.0)

## 2023-02-25 ENCOUNTER — Ambulatory Visit: Payer: Managed Care, Other (non HMO) | Admitting: Family

## 2023-02-25 ENCOUNTER — Encounter: Payer: Self-pay | Admitting: Family

## 2023-02-25 VITALS — BP 126/76 | HR 90 | Temp 97.9°F | Ht 69.0 in | Wt 203.0 lb

## 2023-02-25 DIAGNOSIS — Z8639 Personal history of other endocrine, nutritional and metabolic disease: Secondary | ICD-10-CM | POA: Diagnosis not present

## 2023-02-25 DIAGNOSIS — M62838 Other muscle spasm: Secondary | ICD-10-CM | POA: Diagnosis not present

## 2023-02-25 DIAGNOSIS — M7581 Other shoulder lesions, right shoulder: Secondary | ICD-10-CM | POA: Diagnosis not present

## 2023-02-25 LAB — VITAMIN B12: Vitamin B-12: 887 pg/mL (ref 211–911)

## 2023-02-25 MED ORDER — TIZANIDINE HCL 2 MG PO TABS
2.0000 mg | ORAL_TABLET | Freq: Every evening | ORAL | 0 refills | Status: DC | PRN
Start: 2023-02-25 — End: 2023-09-23

## 2023-02-25 MED ORDER — CYCLOBENZAPRINE HCL 10 MG PO TABS
ORAL_TABLET | ORAL | 0 refills | Status: DC
Start: 2023-02-25 — End: 2023-02-25

## 2023-02-25 MED ORDER — PREDNISONE 10 MG (21) PO TBPK
ORAL_TABLET | ORAL | 0 refills | Status: DC
Start: 2023-02-25 — End: 2023-09-23

## 2023-02-25 NOTE — Assessment & Plan Note (Addendum)
New onset  Suspected tendinitis  Rx tizanidine and prednisone pack for inflammation  Heat/ice to sit prn  Lidocaine as needed Shoulder exercises printed for pt  If no improvement consider xray and orthopedist referral

## 2023-02-25 NOTE — Progress Notes (Signed)
Established Patient Office Visit  Subjective:   Patient ID: Matthew Wu, male    DOB: Dec 01, 1995  Age: 27 y.o. MRN: [id number]  CC:  Chief Complaint  Patient presents with   Shoulder Pain    R shoulder x1 week. 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 No limited range of motion. Has been using anything to help with the pain.    HPI: Tayvin Preslar is a 27 y.o. male presenting on 02/25/2023 for Shoulder Pain (R shoulder x1 week. 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 No limited range of motion. Has been using anything to help with the pain.)  Works at [employer] and often picks up heavy items. He picked up a large weighted bag and he states it hurt just a bit while lifting. He states that he has tingling from base of neck to shoulder down to fingertips. No rotational pain at current, but occasionally with flexion and or right rotation. The tingling is aggravated by movement. Only slight pain with movement of shoulder, but states has been popping for a long time. He also has two jobs so not sure if this was the initial cause as he has had shoulder pain for some time, and seems to be exacerbated.   This occurred about 1.5 weeks ago.  He used a heating pad but doesn't fit his shoulder well.  He has not taking anything over the counter.            ROS: Negative unless specifically indicated above in HPI.   Relevant past medical history reviewed and updated as indicated.   Allergies and medications reviewed and updated.   Current Outpatient Medications:    aluminum-magnesium hydroxide 200-200 MG/5ML suspension, Take by mouth every 6 (six) hours as needed for indigestion., Disp: , Rfl:    amLODipine (NORVASC) 10 MG tablet, Take 1 tablet  Daily  for BP                                                /                                     TAKE                               BY                          MOUTH, Disp: 90 tablet, Rfl: 1   Calcium Carbonate Antacid (TUMS PO), Take by mouth as needed., Disp: , Rfl:    Cholecalciferol 1.25 MG (50000 UT) TABS, Take 1 tablet by mouth once a week., Disp: 8 tablet, Rfl: 0   dicyclomine (BENTYL) 10 MG capsule, Take 1 capsule (10 mg total) by mouth 3 (three) times daily before meals., Disp: 90 capsule, Rfl: 2   famotidine (PEPCID) 20 MG tablet, Take 1 tablet (20 mg total) by mouth 2 (two) times daily., Disp: , Rfl:    hydrochlorothiazide (HYDRODIURIL) 25 MG tablet, Take 1 tab daily in the morning for blood pressure goal < 130/70, Disp:  90 tablet, Rfl: 1   Multiple Vitamin (MULTIVITAMIN ADULT PO), Take 1 tablet by mouth daily., Disp: , Rfl:    pantoprazole (PROTONIX) 40 MG tablet, Take 40 mg by mouth 2 (two) times daily., Disp: , Rfl:    predniSONE (STERAPRED UNI-PAK 21 TAB) 10 MG (21) TBPK tablet, Take as directed, Disp: 1 each, Rfl: 0   tiZANidine (ZANAFLEX) 2 MG tablet, Take 1 tablet (2 mg total) by mouth at bedtime as needed for muscle spasms., Disp: 30 tablet, Rfl: 0  Allergies  Allergen Reactions   Pollen Extract     Other reaction(s): eye redness   Robitussin (Alcohol Free) [Guaifenesin] Nausea And Vomiting    Objective:   BP 126/76 (BP Location: Left Arm, Patient Position: Sitting, Cuff Size: Normal)   Pulse 90   Temp 97.9 F (36.6 C) (Oral)   Ht 5\' 9"  (1.753 m)   Wt 203 lb (92.1 kg)   SpO2 100%   BMI 29.98 kg/m    Physical Exam Musculoskeletal:     Right shoulder: No swelling, tenderness or bony tenderness. Decreased range of motion (very mild). Normal strength.     Left shoulder: No swelling. Normal range of motion.     Comments: Positive apley right side  Positive drop can right side      Assessment & Plan:  Rotator cuff tendinitis, right Assessment & Plan: New onset  Suspected tendinitis  Rx tizanidine and prednisone pack for inflammation  Heat/ice to sit prn  Lidocaine as needed Shoulder  exercises printed for pt  If no improvement consider xray and orthopedist referral  Orders: -     predniSONE; Take as directed  Dispense: 1 each; Refill: 0  Muscle spasm -     tiZANidine HCl; Take 1 tablet (2 mg total) by mouth at bedtime as needed for muscle spasms.  Dispense: 30 tablet; Refill: 0  History of non anemic vitamin B12 deficiency -     Vitamin B12     Follow up plan: Return if symptoms worsen or fail to improve.  Mort Sawyers, FNP

## 2023-02-25 NOTE — Patient Instructions (Signed)
  Start over the counter b12 500 mcg once daily Try to get more sleep melatonin good to try, start 3-5 mg nightly or as needed.

## 2023-03-20 ENCOUNTER — Encounter: Payer: Self-pay | Admitting: Gastroenterology

## 2023-03-26 ENCOUNTER — Ambulatory Visit: Payer: Managed Care, Other (non HMO) | Admitting: Anesthesiology

## 2023-03-26 ENCOUNTER — Encounter: Payer: Self-pay | Admitting: Gastroenterology

## 2023-03-26 ENCOUNTER — Other Ambulatory Visit: Payer: Self-pay

## 2023-03-26 ENCOUNTER — Encounter
Admission: RE | Disposition: A | Discharge status: Home / Self Care | Payer: Self-pay | Source: Home / Self Care | Attending: Gastroenterology

## 2023-03-26 ENCOUNTER — Ambulatory Visit
Admission: RE | Admit: 2023-03-26 | Discharge: 2023-03-26 | Disposition: A | Discharge status: Home / Self Care | Payer: Managed Care, Other (non HMO) | Attending: Gastroenterology | Admitting: Gastroenterology

## 2023-03-26 DIAGNOSIS — Z79899 Other long term (current) drug therapy: Secondary | ICD-10-CM | POA: Insufficient documentation

## 2023-03-26 DIAGNOSIS — E785 Hyperlipidemia, unspecified: Secondary | ICD-10-CM | POA: Diagnosis not present

## 2023-03-26 DIAGNOSIS — K219 Gastro-esophageal reflux disease without esophagitis: Secondary | ICD-10-CM

## 2023-03-26 DIAGNOSIS — K2289 Other specified disease of esophagus: Secondary | ICD-10-CM | POA: Diagnosis not present

## 2023-03-26 DIAGNOSIS — R1013 Epigastric pain: Secondary | ICD-10-CM

## 2023-03-26 DIAGNOSIS — I10 Essential (primary) hypertension: Secondary | ICD-10-CM | POA: Insufficient documentation

## 2023-03-26 DIAGNOSIS — J45909 Unspecified asthma, uncomplicated: Secondary | ICD-10-CM | POA: Diagnosis not present

## 2023-03-26 DIAGNOSIS — R1310 Dysphagia, unspecified: Secondary | ICD-10-CM

## 2023-03-26 HISTORY — PX: BIOPSY: SHX5522

## 2023-03-26 HISTORY — PX: ESOPHAGOGASTRODUODENOSCOPY (EGD) WITH PROPOFOL: SHX5813

## 2023-03-26 SURGERY — ESOPHAGOGASTRODUODENOSCOPY (EGD) WITH PROPOFOL
Anesthesia: General

## 2023-03-26 MED ORDER — SODIUM CHLORIDE 0.9 % IV SOLN: INTRAVENOUS | Status: DC

## 2023-03-26 MED ORDER — LIDOCAINE HCL (CARDIAC) PF 100 MG/5ML IV SOSY
PREFILLED_SYRINGE | INTRAVENOUS | Status: DC | PRN
  Administered 2023-03-26: 200 mg via INTRAVENOUS

## 2023-03-26 MED ORDER — PROPOFOL 10 MG/ML IV BOLUS
INTRAVENOUS | Status: DC | PRN
  Administered 2023-03-26: 50 mg via INTRAVENOUS
  Administered 2023-03-26: 100 mg via INTRAVENOUS
  Administered 2023-03-26: 50 mg via INTRAVENOUS

## 2023-03-26 MED ORDER — DEXMEDETOMIDINE HCL IN NACL 80 MCG/20ML IV SOLN
INTRAVENOUS | Status: DC | PRN
  Administered 2023-03-26: 8 ug via INTRAVENOUS

## 2023-03-26 NOTE — Anesthesia Preprocedure Evaluation (Signed)
Anesthesia Evaluation  Patient identified by MRN, date of birth, ID band Patient awake    Reviewed: Allergy & Precautions, NPO status , Patient's Chart, lab work & pertinent test results  History of Anesthesia Complications Negative for: history of anesthetic complications  Airway Mallampati: II  TM Distance: >3 FB Neck ROM: Full    Dental no notable dental hx. (+) Teeth Intact   Pulmonary neg pulmonary ROS, neg sleep apnea, neg COPD, Patient abstained from smoking.Not current smoker Chldhood asthma   Pulmonary exam normal breath sounds clear to auscultation       Cardiovascular Exercise Tolerance: Good METShypertension, Pt. on medications (-) CAD and (-) Past MI (-) dysrhythmias  Rhythm:Regular Rate:Normal - Systolic murmurs    Neuro/Psych  PSYCHIATRIC DISORDERS Anxiety     negative neurological ROS     GI/Hepatic ,GERD  Medicated,,(+)     (-) substance abuse    Endo/Other  neg diabetes    Renal/GU negative Renal ROS     Musculoskeletal   Abdominal   Peds  Hematology   Anesthesia Other Findings Past Medical History: No date: Asthma No date: GERD (gastroesophageal reflux disease) No date: Hyperlipidemia No date: Hypertension  Reproductive/Obstetrics                             Anesthesia Physical Anesthesia Plan  ASA: 2  Anesthesia Plan: General   Post-op Pain Management: Minimal or no pain anticipated   Induction: Intravenous  PONV Risk Score and Plan: 2 and Propofol infusion, TIVA and Ondansetron  Airway Management Planned: Nasal Cannula  Additional Equipment: None  Intra-op Plan:   Post-operative Plan:   Informed Consent: I have reviewed the patients History and Physical, chart, labs and discussed the procedure including the risks, benefits and alternatives for the proposed anesthesia with the patient or authorized representative who has indicated his/her  understanding and acceptance.     Dental advisory given  Plan Discussed with: CRNA and Surgeon  Anesthesia Plan Comments: (Discussed risks of anesthesia with patient, including possibility of difficulty with spontaneous ventilation under anesthesia necessitating airway intervention, PONV, and rare risks such as cardiac or respiratory or neurological events, and allergic reactions. Discussed the role of CRNA in patient's perioperative care. Patient understands.)       Anesthesia Quick Evaluation

## 2023-03-26 NOTE — H&P (Signed)
Midge Minium, MD [facility] [address] [place], [place] [address] Phone:[phone] Fax : [phone]  Primary Care Physician:  Mort Sawyers, FNP Primary Gastroenterologist:  Dr. Servando Snare  Pre-Procedure History & Physical: HPI:  Matthew Wu is a 27 y.o. male is here for an endoscopy.   Past Medical History:  Diagnosis Date   Asthma    GERD (gastroesophageal reflux disease)    Hyperlipidemia    Hypertension     Past Surgical History:  Procedure Laterality Date   NO PAST SURGERIES      Prior to Admission medications   Medication Sig Start Date End Date Taking? Authorizing Provider  amLODipine (NORVASC) 10 MG tablet Take 1 tablet  Daily  for BP                                                /                                     TAKE                               BY                         MOUTH 12/27/21  Yes Lucky Cowboy, MD  Cholecalciferol 1.25 MG (50000 UT) TABS Take 1 tablet by mouth once a week. 02/19/23  Yes Dugal, Wyatt Mage, FNP  famotidine (PEPCID) 20 MG tablet Take 1 tablet (20 mg total) by mouth 2 (two) times daily. 02/18/23  Yes Celso Amy, PA-C  aluminum-magnesium hydroxide 200-200 MG/5ML suspension Take by mouth every 6 (six) hours as needed for indigestion.    [provider]  Calcium Carbonate Antacid (TUMS PO) Take by mouth as needed.    [provider]  dicyclomine (BENTYL) 10 MG capsule Take 1 capsule (10 mg total) by mouth 3 (three) times daily before meals. 02/18/23 05/19/23  Celso Amy, PA-C  hydrochlorothiazide (HYDRODIURIL) 25 MG tablet Take 1 tab daily in the morning for blood pressure goal < 130/70 Patient not taking: Reported on 03/26/2023 08/08/20   Judd Gaudier, NP  Multiple Vitamin (MULTIVITAMIN ADULT PO) Take 1 tablet by mouth daily.    [provider]  pantoprazole (PROTONIX) 40 MG tablet Take 40 mg by mouth 2 (two) times daily.    [provider]  predniSONE (STERAPRED UNI-PAK 21 TAB) 10 MG (21) TBPK  tablet Take as directed Patient not taking: Reported on 03/26/2023 02/25/23   Mort Sawyers, FNP  tiZANidine (ZANAFLEX) 2 MG tablet Take 1 tablet (2 mg total) by mouth at bedtime as needed for muscle spasms. 02/25/23   Mort Sawyers, FNP    Allergies as of 02/18/2023 - Review Complete 02/18/2023  Allergen Reaction Noted   Pollen extract  12/22/2021   Robitussin (alcohol free) [guaifenesin] Nausea And Vomiting 05/30/2015    Family History  Problem Relation Age of Onset   Stroke Mother    Hypertension Maternal Grandmother    Heart attack Maternal Grandmother 2   Dementia Maternal Grandfather     Social History   Socioeconomic History   Marital status: Significant Other    Spouse name: Not on file   Number of  children: 1   Years of education: Not on file   Highest education level: 12th grade  Occupational History    Employer: [employer]    Comment: produce  Tobacco Use   Smoking status: Never   Smokeless tobacco: Never  Vaping Use   Vaping status: Never Used  Substance and Sexual Activity   Alcohol use: Not Currently   Drug use: No   Sexual activity: Yes    Partners: Male    Birth control/protection: Coitus interruptus  Other Topics Concern   Not on file  Social History Narrative   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   Social Determinants of Health   Financial Resource Strain: Low Risk  (01/06/2023)   Overall Financial Resource Strain (CARDIA)    Difficulty of Paying Living Expenses: Not very hard  Food Insecurity: No Food Insecurity (01/06/2023)   Hunger Vital Sign    Worried About Running Out of Food in the Last Year: Never true    Ran Out of Food in the Last Year: Never true  Transportation Needs: No Transportation Needs (01/06/2023)   PRAPARE - Administrator, Civil Service (Medical): No    Lack of Transportation (Non-Medical): No  Physical Activity: Insufficiently Active (01/06/2023)   Exercise Vital Sign    Days of Exercise per Week: 3 days    Minutes of  Exercise per Session: 30 min  Stress: No Stress Concern Present (01/06/2023)   [employer] of Occupational Health - Occupational Stress Questionnaire    Feeling of Stress : Only a little  Social Connections: Unknown (01/06/2023)   Social Connection and Isolation Panel [NHANES]    Frequency of Communication with Friends and Family: Three times a week    Frequency of Social Gatherings with Friends and Family: Once a week    Attends Religious Services: Never    Database administrator or Organizations: No    Attends Engineer, structural: Not on file    Marital Status: Patient declined  Catering manager Violence: Not on file    Review of Systems: See HPI, otherwise negative ROS  Physical Exam: BP (!) 153/86   Pulse 73   Temp (!) 96.6 F (35.9 C) (Temporal)   Resp 18   Ht 5\' 8"  (1.727 m)   Wt 92.4 kg   SpO2 100%   BMI 30.96 kg/m  General:   Alert,  pleasant and cooperative in NAD Head:  Normocephalic and atraumatic. Neck:  Supple; no masses or thyromegaly. Lungs:  Clear throughout to auscultation.    Heart:  Regular rate and rhythm. Abdomen:  Soft, nontender and nondistended. Normal bowel sounds, without guarding, and without rebound.   Neurologic:  Alert and  oriented x4;  grossly normal neurologically.  Impression/Plan: Matthew Wu is here for an endoscopy to be performed for chronic persistent worsening GERD, epigastric pain, and dysphagia   Risks, benefits, limitations, and alternatives regarding  endoscopy have been reviewed with the patient.  Questions have been answered.  All parties agreeable.   Midge Minium, MD  03/26/2023, 9:22 AM

## 2023-03-26 NOTE — Anesthesia Postprocedure Evaluation (Signed)
Anesthesia Post Note  Patient: Matthew Wu  Procedure(s) Performed: ESOPHAGOGASTRODUODENOSCOPY (EGD) WITH PROPOFOL BIOPSY Balloon dilation wire-guided  Patient location during evaluation: Endoscopy Anesthesia Type: General Level of consciousness: awake and alert Pain management: pain level controlled Vital Signs Assessment: post-procedure vital signs reviewed and stable Respiratory status: spontaneous breathing, nonlabored ventilation, respiratory function stable and patient connected to nasal cannula oxygen Cardiovascular status: blood pressure returned to baseline and stable Postop Assessment: no apparent nausea or vomiting Anesthetic complications: no   No notable events documented.   Last Vitals:  Vitals:   03/26/23 0940 03/26/23 0950  BP: 132/72   Pulse:  63  Resp:    Temp: (!) 36.1 C   SpO2:  100%    Last Pain:  Vitals:   03/26/23 0950  TempSrc:   PainSc: 0-No pain                 Corinda Gubler

## 2023-03-26 NOTE — Op Note (Signed)
[facility] Gastroenterology Patient Name: Matthew Wu Procedure Date: 03/26/2023 9:24 AM MRN: [id number] Account #: [id number] Date of Birth: 1996/02/09 Admit Type: Outpatient Age: 27 Room: [facility] ENDO ROOM 4 Gender: Male Note Status: Finalized Instrument Name: Upper Endoscope [id number] Procedure:             Upper GI endoscopy Indications:           Epigastric abdominal pain, Dysphagia, Heartburn Providers:             Midge Minium MD, MD Referring MD:          Mort Sawyers (Referring MD) Medicines:             Propofol per Anesthesia Complications:         No immediate complications. Procedure:             Pre-Anesthesia Assessment:                        - Prior to the procedure, a History and Physical was                         performed, and patient medications and allergies were                         reviewed. The patient's tolerance of previous                         anesthesia was also reviewed. The risks and benefits                         of the procedure and the sedation options and risks                         were discussed with the patient. All questions were                         answered, and informed consent was obtained. Prior                         Anticoagulants: The patient has taken no anticoagulant                         or antiplatelet agents. ASA Grade Assessment: I - A                         normal, healthy patient. After reviewing the risks and                         benefits, the patient was deemed in satisfactory                         condition to undergo the procedure.                        After obtaining informed consent, the endoscope was                         passed under direct vision. Throughout the procedure,  the patient's blood pressure, pulse, and oxygen                         saturations were monitored continuously. The Endoscope                         was introduced through the  mouth, and advanced to the                         second part of duodenum. The upper GI endoscopy was                         accomplished without difficulty. The patient tolerated                         the procedure well. Findings:      The examined esophagus was normal. Several biopsies were obtained in the       middle third of the esophagus with cold forceps for histology. A TTS       dilator was passed through the scope. Dilation with a 15-16.5-18 mm       balloon dilator was performed to 18 mm. The dilation site was examined       following endoscope reinsertion and showed complete resolution of       luminal narrowing.      The stomach was normal.      The examined duodenum was normal. Impression:            - Normal esophagus. Dilated.                        - Normal stomach.                        - Normal examined duodenum.                        - Several biopsies were obtained in the middle third                         of the esophagus. Recommendation:        - Discharge patient to home.                        - Resume previous diet.                        - Continue present medications.                        - Await pathology results. Procedure Code(s):     --- Professional ---                        [phone], Esophagogastroduodenoscopy, flexible,                         transoral; with transendoscopic balloon dilation of                         esophagus (less than 30 mm diameter)  [id number], 59, Esophagogastroduodenoscopy, flexible,                         transoral; with biopsy, single or multiple Diagnosis Code(s):     --- Professional ---                        R13.10, Dysphagia, unspecified                        R10.13, Epigastric pain CPT copyright 2022 American [employer] Association. All rights reserved. The codes documented in this report are preliminary and upon coder review may  be revised to meet current compliance requirements. Midge Minium MD, MD 03/26/2023 9:39:12 AM This report has been signed electronically. Number of Addenda: 0 Note Initiated On: 03/26/2023 9:24 AM Estimated Blood Loss:  Estimated blood loss: none.      [facility]

## 2023-03-26 NOTE — Transfer of Care (Signed)
Immediate Anesthesia Transfer of Care Note  Patient: Matthew Wu  Procedure(s) Performed: ESOPHAGOGASTRODUODENOSCOPY (EGD) WITH PROPOFOL BIOPSY Balloon dilation wire-guided  Patient Location: PACU  Anesthesia Type:General  Level of Consciousness: awake, alert , and oriented  Airway & Oxygen Therapy: Patient Spontanous Breathing  Post-op Assessment: Report given to RN and Post -op Vital signs reviewed and stable  Post vital signs: Reviewed and stable  Last Vitals:  Vitals Value Taken Time  BP 132/72 03/26/23 0941  Temp 36.1 C 03/26/23 0940  Pulse 64 03/26/23 0941  Resp 15 03/26/23 0941  SpO2 95 % 03/26/23 0941  Vitals shown include unfiled device data.  Last Pain:  Vitals:   03/26/23 0940  TempSrc: Temporal  PainSc: Asleep         Complications: No notable events documented.

## 2023-03-27 ENCOUNTER — Encounter: Payer: Self-pay | Admitting: Gastroenterology

## 2023-03-27 LAB — SURGICAL PATHOLOGY

## 2023-03-30 ENCOUNTER — Encounter: Payer: Self-pay | Admitting: Gastroenterology

## 2023-03-30 NOTE — Progress Notes (Signed)
noted 

## 2023-04-13 ENCOUNTER — Other Ambulatory Visit: Payer: Self-pay | Admitting: Family

## 2023-04-13 DIAGNOSIS — E559 Vitamin D deficiency, unspecified: Secondary | ICD-10-CM

## 2023-05-19 ENCOUNTER — Other Ambulatory Visit: Payer: Self-pay

## 2023-05-19 ENCOUNTER — Other Ambulatory Visit: Payer: Self-pay | Admitting: Family

## 2023-05-19 MED ORDER — PANTOPRAZOLE SODIUM 40 MG PO TBEC
40.0000 mg | DELAYED_RELEASE_TABLET | Freq: Two times a day (BID) | ORAL | 2 refills | Status: DC
  Filled 2023-05-19: qty 60, 30d supply, fill #0

## 2023-06-02 ENCOUNTER — Other Ambulatory Visit: Payer: Self-pay

## 2023-08-25 ENCOUNTER — Other Ambulatory Visit: Payer: Self-pay

## 2023-09-10 ENCOUNTER — Encounter: Payer: Self-pay | Admitting: Emergency Medicine

## 2023-09-10 ENCOUNTER — Emergency Department
Admission: EM | Admit: 2023-09-10 | Discharge: 2023-09-10 | Disposition: A | Discharge status: Home / Self Care | Attending: Emergency Medicine | Admitting: Emergency Medicine

## 2023-09-10 ENCOUNTER — Emergency Department

## 2023-09-10 ENCOUNTER — Other Ambulatory Visit: Payer: Self-pay

## 2023-09-10 DIAGNOSIS — I10 Essential (primary) hypertension: Secondary | ICD-10-CM | POA: Diagnosis present

## 2023-09-10 DIAGNOSIS — R519 Headache, unspecified: Secondary | ICD-10-CM | POA: Diagnosis not present

## 2023-09-10 DIAGNOSIS — J45909 Unspecified asthma, uncomplicated: Secondary | ICD-10-CM | POA: Diagnosis not present

## 2023-09-10 DIAGNOSIS — Z79899 Other long term (current) drug therapy: Secondary | ICD-10-CM | POA: Insufficient documentation

## 2023-09-10 LAB — BASIC METABOLIC PANEL WITH GFR
Anion gap: 11 (ref 5–15)
BUN: 12 mg/dL (ref 6–20)
CO2: 24 mmol/L (ref 22–32)
Calcium: 9.4 mg/dL (ref 8.9–10.3)
Chloride: 102 mmol/L (ref 98–111)
Creatinine, Ser: 0.94 mg/dL (ref 0.61–1.24)
GFR, Estimated: >60 mL/min (ref >60)
Glucose, Bld: 84 mg/dL (ref 70–99)
Potassium: 3.7 mmol/L (ref 3.5–5.1)
Sodium: 137 mmol/L (ref 135–145)

## 2023-09-10 LAB — CBC
HCT: 46.7 % (ref 39.0–52.0)
Hemoglobin: 16 g/dL (ref 13.0–17.0)
MCH: 29.7 pg (ref 26.0–34.0)
MCHC: 34.3 g/dL (ref 30.0–36.0)
MCV: 86.8 fL (ref 80.0–100.0)
Platelets: 356 10*3/uL (ref 150–400)
RBC: 5.38 MIL/uL (ref 4.22–5.81)
RDW: 12.7 % (ref 11.5–15.5)
WBC: 8.8 10*3/uL (ref 4.0–10.5)
nRBC: 0 % (ref 0.0–0.2)

## 2023-09-10 NOTE — ED Provider Notes (Signed)
 [facility] Provider Note    Event Date/Time   First MD Initiated Contact with Patient 09/10/23 1054     (approximate)   History   Chief Complaint Hypertension   HPI  Matthew Wu is a 28 y.o. male with past medical history of hypertension, hyperlipidemia, asthma, and GERD who presents to the ED complaining of hypertension.  Patient reports that he has had a worsening headache over the past 2 days, primarily affecting the right side of his head.  He describes it as a dull ache and denies any associated fevers, neck stiffness, vision changes, speech changes, numbness, or weakness.  He decided to check his blood pressure this morning due to the headache, found it to be elevated at 180/100.  He takes amlodipine  10 mg daily for his blood pressure, reports taking it as prescribed over the past few days including his usual dose this morning.  He has not had any chest pain, shortness of breath, or abdominal pain.     Physical Exam   Triage Vital Signs: ED Triage Vitals  Encounter Vitals Group     BP 09/10/23 1028 (!) 158/100     Systolic BP Percentile --      Diastolic BP Percentile --      Pulse Rate 09/10/23 1028 92     Resp 09/10/23 1028 18     Temp 09/10/23 1028 98.3 F (36.8 C)     Temp Source 09/10/23 1028 Oral     SpO2 09/10/23 1028 100 %     Weight 09/10/23 1029 208 lb (94.3 kg)     Height 09/10/23 1029 5\' 8"  (1.727 m)     Head Circumference --      Peak Flow --      Pain Score 09/10/23 1029 5     Pain Loc --      Pain Education --      Exclude from Growth Chart --     Most recent vital signs: Vitals:   09/10/23 1028  BP: (!) 158/100  Pulse: 92  Resp: 18  Temp: 98.3 F (36.8 C)  SpO2: 100%    Constitutional: Alert and oriented. Eyes: Conjunctivae are normal. Head: Atraumatic. Nose: No congestion/rhinnorhea. Mouth/Throat: Mucous membranes are moist.  Neck: Supple with no meningismus. Cardiovascular: Normal rate, regular rhythm.  Grossly normal heart sounds.  2+ radial pulses bilaterally. Respiratory: Normal respiratory effort.  No retractions. Lungs CTAB. Gastrointestinal: Soft and nontender. No distention. Musculoskeletal: No lower extremity tenderness nor edema.  Neurologic:  Normal speech and language. No gross focal neurologic deficits are appreciated.    ED Results / Procedures / Treatments   Labs (all labs ordered are listed, but only abnormal results are displayed) Labs Reviewed  CBC  BASIC METABOLIC PANEL WITH GFR     EKG  ED ECG REPORT I, Twilla Galea, the attending physician, personally viewed and interpreted this ECG.   Date: 09/10/2023  EKG Time: 11:39  Rate: 73  Rhythm: normal sinus rhythm  Axis: Normal  Intervals:none  ST&T Change: None  RADIOLOGY CT head reviewed and interpreted by me with no hemorrhage or midline shift.  PROCEDURES:  Critical Care performed: No  Procedures   MEDICATIONS ORDERED IN ED: Medications - No data to display   IMPRESSION / MDM / ASSESSMENT AND PLAN / ED COURSE  I reviewed the triage vital signs and the nursing notes.  28 y.o. male with past medical history of hypertension, hyperlipidemia, asthma, and GERD who presents to the ED complaining of gradually worsening headache over the past 2 days with elevated blood pressure.  Patient's presentation is most consistent with acute presentation with potential threat to life or bodily function.  Differential diagnosis includes, but is not limited to, stroke, SAH, meningitis, tension headache, migraine headache, AKI, electrolyte abnormality, medication noncompliance.  Patient nontoxic-appearing and in no acute distress, vital signs remarkable for hypertension but otherwise reassuring.  He complains of a right-sided headache but has no focal neurologic deficits on exam and no findings concerning for meningitis.  CT head is pending at this time, labs without significant  anemia, leukocytosis, electrolyte abnormality, or AKI.  CT head is negative for acute process, BP improving on recheck and there is no evidence of hypertensive emergency.  Patient counseled to follow-up with PCP for recheck of his blood pressure and to return to the ED for new or worsening symptoms, patient agrees with plan.      FINAL CLINICAL IMPRESSION(S) / ED DIAGNOSES   Final diagnoses:  Acute nonintractable headache, unspecified headache type  Uncontrolled hypertension     Rx / DC Orders   ED Discharge Orders     None        Note:  This document was prepared using Dragon voice recognition software and may include unintentional dictation errors.   Twilla Galea, MD 09/10/23 [phone]

## 2023-09-10 NOTE — ED Triage Notes (Signed)
 Pt via POV from home. Pt c/o HTN, headache, and nausea started today, reports his BP has been high the last 3 days. Takes amlodipine  for same and denies missing any doses. Pt is A&Ox4 and NAD, ambulatory to triage.

## 2023-09-22 ENCOUNTER — Ambulatory Visit: Payer: Self-pay

## 2023-09-22 NOTE — Telephone Encounter (Signed)
 Chief Complaint: dizziness Symptoms: dizziness Frequency: x 2 weeks Pertinent Negatives: Patient denies fainting Disposition: [] ED /[] Urgent Care (no appt availability in office) / [x] Appointment(In office/virtual)/ []  [facility]/ [] Home Care/ [] Refused Recommended Disposition /[] [place] [facility]/ []  Follow-up with PCP Additional Notes: pt states that he has been having some lightheaded and dizziness for about 2 weeks. States he has not check his BP today but had some lightheaded and dizziness. States symptoms have been coming and going.  States BP 172/105 P 122.   Copied from CRM [id number]. Topic: Clinical - Red Word Triage >> Sep 22, 2023  2:55 PM Dewanda Foots wrote: Red Word that prompted transfer to Nurse Triage: Pt states that he is light headed and dizzy especially in the mornings. States he has had a sinus infection for 2 weeks but has been over it for about a week and still experiencing light headedness and dizziness.  Was seen at [facility] about a week ago and also had high bp of 180/100. Reason for Disposition  [1] MODERATE dizziness (e.g., interferes with normal activities) AND [2] has NOT been evaluated by doctor (or NP/PA) for this  (Exception: Dizziness caused by heat exposure, sudden standing, or poor fluid intake.)  Systolic BP  >= 180 OR Diastolic >= 110  Answer Assessment - Initial Assessment Questions 1. DESCRIPTION: "Describe your dizziness."     lightheaded 2. LIGHTHEADED: "Do you feel lightheaded?" (e.g., somewhat faint, woozy, weak upon standing)     Woozy and dizzy 3. VERTIGO: "Do you feel like either you or the room is spinning or tilting?" (i.e. vertigo)     no 4. SEVERITY: "How bad is it?"  "Do you feel like you are going to faint?" "Can you stand and walk?"   - MILD: Feels slightly dizzy, but walking normally.   - MODERATE: Feels unsteady when walking, but not falling; interferes with normal activities (e.g., school, work).   - SEVERE:  Unable to walk without falling, or requires assistance to walk without falling; feels like passing out now.      mild 5. ONSET:  "When did the dizziness begin?"     About 2 weeks ago 6. AGGRAVATING FACTORS: "Does anything make it worse?" (e.g., standing, change in head position)     standing 7. HEART RATE: "Can you tell me your heart rate?" "How many beats in 15 seconds?"  (Note: not all patients can do this)       yes 8. CAUSE: "What do you think is causing the dizziness?"     Possible BP 9. RECURRENT SYMPTOM: "Have you had dizziness before?" If Yes, ask: "When was the last time?" "What happened that time?"     Off and on for a week 10. OTHER SYMPTOMS: "Do you have any other symptoms?" (e.g., fever, chest pain, vomiting, diarrhea, bleeding)       headache  Protocols used: Dizziness - Lightheadedness-A-AH, Blood Pressure - High-A-AH

## 2023-09-23 ENCOUNTER — Encounter: Payer: Self-pay | Admitting: Family

## 2023-09-23 ENCOUNTER — Ambulatory Visit (INDEPENDENT_AMBULATORY_CARE_PROVIDER_SITE_OTHER): Admitting: Family

## 2023-09-23 VITALS — BP 142/96 | HR 66 | Temp 98.6°F | Ht 69.0 in | Wt 210.0 lb

## 2023-09-23 DIAGNOSIS — M7581 Other shoulder lesions, right shoulder: Secondary | ICD-10-CM

## 2023-09-23 DIAGNOSIS — I10 Essential (primary) hypertension: Secondary | ICD-10-CM | POA: Insufficient documentation

## 2023-09-23 MED ORDER — AMLODIPINE BESYLATE 10 MG PO TABS: 10.0000 mg | ORAL_TABLET | Freq: Every day | ORAL | 3 refills | Status: AC

## 2023-09-23 MED ORDER — HYDROCHLOROTHIAZIDE 25 MG PO TABS
ORAL_TABLET | ORAL | 3 refills | Status: AC
Start: 2023-09-23 — End: ?

## 2023-09-23 NOTE — Assessment & Plan Note (Signed)
 Pt going for massage therapy at this time.  He is requesting referral, advised pt that if he can let me know where I would need to send referral to I would be happy to send. He will get me the information.

## 2023-09-23 NOTE — Assessment & Plan Note (Signed)
 Not controlled non compliant as ran out of hydrochlorothiazide   Refilled today for 25 mg once daily Continue amlodipine  10 mg once daily.  Pt advised of the following:  Continue medication as prescribed. Monitor blood pressure periodically and/or when you feel symptomatic. Goal is <130/90 on average. Ensure that you have rested for 30 minutes prior to checking your blood pressure. Record your readings and bring them to your next visit if necessary.work on a low sodium diet.

## 2023-09-23 NOTE — Progress Notes (Signed)
 Established Patient Office Visit  Subjective:   Patient ID: Matthew Wu, male    DOB: Nov 05, 1995  Age: 28 y.o. MRN: [id number]  CC:  Chief Complaint  Patient presents with   Acute Visit    Increased BP, lightheadedness and headache. Highest BP reading was 186/104.    HPI: Matthew Wu is a 28 y.o. male presenting on 09/23/2023 for Acute Visit (Increased BP, lightheadedness and headache. Highest BP reading was 186/104.)  HTN: has been having high blood pressures, highest was 186/104 this was within the last two weeks. He doesn't have a headache but has felt a bit dizzy over the last few weeks. He did have a sinus infection a few weeks ago but taking mucinex and had improvement in symptoms. Currently without any sinus symptoms, not taking mucinex.  No cp and or sob or blurry vision. He is taking amlodipine  10 mg once daily. He was on hydrochlorothiazide  25 mg once daily but ran out a few months ago.   Did go to the ER [facility] 4/24 for HTN with headaches. His bp in ER was 158/100.  EKG was NSR, CT head negative for acute process.    Rotator cuff tendinitis: currently seeing a therapist who is localizing massage into the area and there has been great benefit.      ROS: Negative unless specifically indicated above in HPI.   Relevant past medical history reviewed and updated as indicated.   Allergies and medications reviewed and updated.   Current Outpatient Medications:    aluminum-magnesium hydroxide 200-200 MG/5ML suspension, Take by mouth every 6 (six) hours as needed for indigestion., Disp: , Rfl:    famotidine  (PEPCID ) 20 MG tablet, Take 1 tablet (20 mg total) by mouth 2 (two) times daily., Disp: , Rfl:    pantoprazole  (PROTONIX ) 40 MG tablet, Take 1 tablet (40 mg total) by mouth 2 (two) times daily., Disp: 60 tablet, Rfl: 2   amLODipine  (NORVASC ) 10 MG tablet, Take 1 tablet (10 mg total) by mouth daily., Disp: 90 tablet, Rfl: 3   hydrochlorothiazide  (HYDRODIURIL ) 25 MG  tablet, Take 1 tab daily in the morning for blood pressure goal < 130/70, Disp: 90 tablet, Rfl: 3  Allergies  Allergen Reactions   Pollen Extract     Other reaction(s): eye redness   Robitussin (Alcohol Free) [Guaifenesin] Nausea And Vomiting    Objective:   BP (!) 142/96   Pulse 66   Temp 98.6 F (37 C) (Temporal)   Ht 5\' 9"  (1.753 m)   Wt 210 lb (95.3 kg)   SpO2 98%   BMI 31.01 kg/m    Physical Exam Vitals reviewed.  Constitutional:      General: He is not in acute distress.    Appearance: Normal appearance. He is normal weight. He is not ill-appearing, toxic-appearing or diaphoretic.  Cardiovascular:     Rate and Rhythm: Normal rate and regular rhythm.  Pulmonary:     Effort: Pulmonary effort is normal.     Breath sounds: Normal breath sounds.  Musculoskeletal:        General: Normal range of motion.  Neurological:     General: No focal deficit present.     Mental Status: He is alert and oriented to person, place, and time. Mental status is at baseline.  Psychiatric:        Mood and Affect: Mood normal.        Behavior: Behavior normal.        Thought Content: Thought content normal.  Judgment: Judgment normal.     Assessment & Plan:  Primary hypertension Assessment & Plan: Not controlled non compliant as ran out of hydrochlorothiazide   Refilled today for 25 mg once daily Continue amlodipine  10 mg once daily.  Pt advised of the following:  Continue medication as prescribed. Monitor blood pressure periodically and/or when you feel symptomatic. Goal is <130/90 on average. Ensure that you have rested for 30 minutes prior to checking your blood pressure. Record your readings and bring them to your next visit if necessary.work on a low sodium diet.   Orders: -     hydroCHLOROthiazide ; Take 1 tab daily in the morning for blood pressure goal < 130/70  Dispense: 90 tablet; Refill: 3 -     amLODIPine  Besylate; Take 1 tablet (10 mg total) by mouth daily.   Dispense: 90 tablet; Refill: 3  Rotator cuff tendinitis, right Assessment & Plan: Pt going for massage therapy at this time.  He is requesting referral, advised pt that if he can let me know where I would need to send referral to I would be happy to send. He will get me the information.        Follow up plan: Return in about 3 months (around 12/24/2023) for f/u CPE.  Felicita Horns, FNP

## 2023-11-09 ENCOUNTER — Ambulatory Visit (INDEPENDENT_AMBULATORY_CARE_PROVIDER_SITE_OTHER): Admitting: Family Medicine

## 2023-11-09 ENCOUNTER — Encounter: Payer: Self-pay | Admitting: Family Medicine

## 2023-11-09 VITALS — BP 136/76 | HR 64 | Temp 98.2°F | Ht 69.0 in | Wt 209.4 lb

## 2023-11-09 DIAGNOSIS — R140 Abdominal distension (gaseous): Secondary | ICD-10-CM | POA: Insufficient documentation

## 2023-11-09 DIAGNOSIS — K2100 Gastro-esophageal reflux disease with esophagitis, without bleeding: Secondary | ICD-10-CM | POA: Diagnosis not present

## 2023-11-09 DIAGNOSIS — K760 Fatty (change of) liver, not elsewhere classified: Secondary | ICD-10-CM | POA: Diagnosis not present

## 2023-11-09 DIAGNOSIS — R1013 Epigastric pain: Secondary | ICD-10-CM

## 2023-11-09 DIAGNOSIS — K9049 Malabsorption due to intolerance, not elsewhere classified: Secondary | ICD-10-CM | POA: Diagnosis not present

## 2023-11-09 NOTE — Patient Instructions (Addendum)
 Go 1-2 weeks without dairy products just to see if this helps   Avoid foods that trigger you  Avoid nsaids    Go back to pantoprazole  twice daily instead of once just to see if it helps  If it does not help - after a week or two, go back to once daily    Take the generic pepcid   twice daily also    I placed a referral to GI in [facility]  [phone]

## 2023-11-09 NOTE — Assessment & Plan Note (Signed)
 Lab Results  Component Value Date   ALT 49 (H) 02/18/2023   AST 22 02/18/2023   ALKPHOS 119 02/18/2023   BILITOT 0.6 02/18/2023   Doubt adding to current symptoms Weight loss advised

## 2023-11-09 NOTE — Assessment & Plan Note (Signed)
 Eating makes his pain and bloating worse Eating bland Urged 1-2 wk trial off dairy to see if it helps

## 2023-11-09 NOTE — Assessment & Plan Note (Signed)
 With GERD, epigastric discomfort and some nausea   Reviewed GI history, EGD, CT and us  as well as labs   Plan to incr pantoprazole  back to 40 mg bid  Avoid dairy for a trial  Ref to new GI since his provider left

## 2023-11-09 NOTE — Assessment & Plan Note (Signed)
 See a/p for gerd and bloating   Ref to gi  Incr ppi to bid

## 2023-11-09 NOTE — Assessment & Plan Note (Signed)
 Worsened epigastric discomfort /food intol  Reviewed GI notes, EGD, CT , US  and labs   Instructed to increase pantoprazole  back to bid Ref to GI in [place] since his provider left   Call back and Er precautions noted in detail today

## 2023-11-09 NOTE — Progress Notes (Signed)
 Subjective:    Patient ID: Matthew Wu, male    DOB: 01/17/1996, 28 y.o.   MRN: [id number]  HPI  Wt Readings from Last 3 Encounters:  11/09/23 209 lb 6 oz (95 kg)  09/23/23 210 lb (95.3 kg)  09/10/23 208 lb (94.3 kg)   30.92 kg/m  Vitals:   11/09/23 1430  BP: 136/76  Pulse: 64  Temp: 98.2 F (36.8 C)  SpO2: 98%    28 yo M pt of NP Dugal presents with c/o  GI problems Nausea   Nausea -worse for past 2 weeks  Regurgitates food  Feels like food sits in upper abdomen for a long time   Is eating bland food  Gets full very fast  Feels like he needs to vomit and cannot   Was constipated for a while  Very small bms  Never had a colonoscopy before   Very rarely takes nsaids  No caffeine  No sodas  No etoh as a rule   Tried probiotic Tried fiber   Weight is up and down   GERD Pepcid  20 mg bid - Pantoprazole  40 mg bid -now just on once per day   Normal EGD 03/2023 with dr Jinny Bx results consistent with GERD  Had US  in August  Fatty liver  No gallstones Gb looked normal   CT in sept was normal   Lab Results  Component Value Date   NA 137 09/10/2023   K 3.7 09/10/2023   CO2 24 09/10/2023   GLUCOSE 84 09/10/2023   BUN 12 09/10/2023   CREATININE 0.94 09/10/2023   CALCIUM 9.4 09/10/2023   GFR 116.22 01/06/2023   GFRNONAA >60 09/10/2023   Lab Results  Component Value Date   WBC 8.8 09/10/2023   HGB 16.0 09/10/2023   HCT 46.7 09/10/2023   MCV 86.8 09/10/2023   PLT 356 09/10/2023   Lab Results  Component Value Date   LIPASE 14.0 01/06/2023   Lab Results  Component Value Date   ALT 49 (H) 02/18/2023   AST 22 02/18/2023   ALKPHOS 119 02/18/2023   BILITOT 0.6 02/18/2023     Family history  M and grand parents have acid reflux   Lab Results  Component Value Date   HGBA1C 5.4 01/06/2023        Patient Active Problem List   Diagnosis Date Noted   Abdominal bloating 11/09/2023   Food intolerance 11/09/2023   Fatty liver  11/09/2023   Primary hypertension 09/23/2023   Dysphagia 03/26/2023   Gastroesophageal reflux disease 03/26/2023   Abdominal pain, epigastric 03/26/2023   Rotator cuff tendinitis, right 02/25/2023   History of non anemic vitamin B12 deficiency 02/10/2023   History of vitamin D  deficiency 02/10/2023   Gastroesophageal reflux disease with esophagitis without hemorrhage 01/06/2023   Overweight (BMI 25.0-29.9) 01/06/2023   Hyperglycemia 01/06/2023   Irritable bowel syndrome with diarrhea 01/06/2023   Anxiety 07/15/2018   Allergy  to environmental factors 05/03/2018   Vitamin D  deficiency 06/01/2015   Hyperlipidemia 06/01/2015   Past Medical History:  Diagnosis Date   Asthma    GERD (gastroesophageal reflux disease)    Hyperlipidemia    Hypertension    Past Surgical History:  Procedure Laterality Date   BIOPSY  03/26/2023   Procedure: BIOPSY;  Surgeon: Jinny Carmine, MD;  Location: [facility];  Service: Endoscopy;;   ESOPHAGOGASTRODUODENOSCOPY (EGD) WITH PROPOFOL  N/A 03/26/2023   Procedure: ESOPHAGOGASTRODUODENOSCOPY (EGD) WITH PROPOFOL ;  Surgeon: Jinny Carmine, MD;  Location: [facility] ENDOSCOPY;  Service:  Endoscopy;  Laterality: N/A;   NO PAST SURGERIES     Social History   Tobacco Use   Smoking status: Never   Smokeless tobacco: Never  Vaping Use   Vaping status: Never Used  Substance Use Topics   Alcohol use: Not Currently   Drug use: No   Family History  Problem Relation Age of Onset   Stroke Mother    Hypertension Maternal Grandmother    Heart attack Maternal Grandmother 59   Dementia Maternal Grandfather    Allergies  Allergen Reactions   Pollen Extract     Other reaction(s): eye redness   Robitussin (Alcohol Free) [Guaifenesin] Nausea And Vomiting   Current Outpatient Medications on File Prior to Visit  Medication Sig Dispense Refill   aluminum-magnesium hydroxide 200-200 MG/5ML suspension Take by mouth every 6 (six) hours as needed for indigestion.      amLODipine  (NORVASC ) 10 MG tablet Take 1 tablet (10 mg total) by mouth daily. 90 tablet 3   famotidine  (PEPCID ) 20 MG tablet Take 1 tablet (20 mg total) by mouth 2 (two) times daily.     hydrochlorothiazide  (HYDRODIURIL ) 25 MG tablet Take 1 tab daily in the morning for blood pressure goal < 130/70 90 tablet 3   pantoprazole  (PROTONIX ) 40 MG tablet Take 1 tablet (40 mg total) by mouth 2 (two) times daily. 60 tablet 2   No current facility-administered medications on file prior to visit.    Review of Systems  Constitutional:  Negative for activity change, appetite change, fatigue, fever and unexpected weight change.  HENT:  Negative for congestion, rhinorrhea, sore throat and trouble swallowing.   Eyes:  Negative for pain, redness, itching and visual disturbance.  Respiratory:  Negative for cough, chest tightness, shortness of breath and wheezing.   Cardiovascular:  Negative for chest pain and palpitations.  Gastrointestinal:  Positive for abdominal distention, abdominal pain, constipation and nausea. Negative for anal bleeding, blood in stool, diarrhea, rectal pain and vomiting.  Endocrine: Negative for cold intolerance, heat intolerance, polydipsia and polyuria.  Genitourinary:  Negative for difficulty urinating, dysuria, frequency and urgency.  Musculoskeletal:  Negative for arthralgias, joint swelling and myalgias.  Skin:  Negative for pallor and rash.  Neurological:  Negative for dizziness, tremors, weakness, numbness and headaches.  Hematological:  Negative for adenopathy. Does not bruise/bleed easily.  Psychiatric/Behavioral:  Negative for decreased concentration and dysphoric mood. The patient is not nervous/anxious.        Objective:   Physical Exam Constitutional:      General: He is not in acute distress.    Appearance: Normal appearance. He is well-developed. He is obese. He is not ill-appearing or diaphoretic.  HENT:     Head: Normocephalic and atraumatic.   Eyes:      General: No scleral icterus.    Conjunctiva/sclera: Conjunctivae normal.     Pupils: Pupils are equal, round, and reactive to light.    Cardiovascular:     Rate and Rhythm: Normal rate and regular rhythm.     Heart sounds: Normal heart sounds.  Pulmonary:     Effort: Pulmonary effort is normal. No respiratory distress.     Breath sounds: Normal breath sounds. No wheezing or rales.  Abdominal:     General: Abdomen is protuberant. Bowel sounds are normal. There is no distension.     Palpations: Abdomen is soft. There is no shifting dullness, hepatomegaly, splenomegaly, mass or pulsatile mass.     Tenderness: There is abdominal tenderness in the epigastric area  and left upper quadrant. There is no guarding or rebound. Negative signs include Murphy's sign, Rovsing's sign and McBurney's sign.     Hernia: No hernia is present.   Musculoskeletal:     Cervical back: Normal range of motion and neck supple.     Right lower leg: No edema.     Left lower leg: No edema.  Lymphadenopathy:     Cervical: No cervical adenopathy.   Skin:    General: Skin is warm and dry.     Coloration: Skin is not pale.     Findings: No erythema.   Neurological:     Mental Status: He is alert.   Psychiatric:        Mood and Affect: Mood normal.           Assessment & Plan:   Problem List Items Addressed This Visit       Digestive   Gastroesophageal reflux disease with esophagitis without hemorrhage - Primary   Worsened epigastric discomfort /food intol  Reviewed GI notes, EGD, CT , US  and labs   Instructed to increase pantoprazole  back to bid Ref to GI in [place] since his provider left   Call back and Er precautions noted in detail today        Relevant Orders   Ambulatory referral to Gastroenterology   Fatty liver   Lab Results  Component Value Date   ALT 49 (H) 02/18/2023   AST 22 02/18/2023   ALKPHOS 119 02/18/2023   BILITOT 0.6 02/18/2023   Doubt adding to current  symptoms Weight loss advised       Relevant Orders   Ambulatory referral to Gastroenterology     Other   Food intolerance   Eating makes his pain and bloating worse Eating bland Urged 1-2 wk trial off dairy to see if it helps      Relevant Orders   Ambulatory referral to Gastroenterology   Abdominal pain, epigastric   See a/p for gerd and bloating   Ref to gi  Incr ppi to bid       Abdominal bloating   With GERD, epigastric discomfort and some nausea   Reviewed GI history, EGD, CT and us  as well as labs   Plan to incr pantoprazole  back to 40 mg bid  Avoid dairy for a trial  Ref to new GI since his provider left       Relevant Orders   Ambulatory referral to Gastroenterology

## 2023-11-21 ENCOUNTER — Other Ambulatory Visit: Payer: Self-pay | Admitting: Family

## 2023-12-10 ENCOUNTER — Ambulatory Visit: Payer: Self-pay

## 2023-12-10 NOTE — Telephone Encounter (Signed)
 FYI Only or Action Required?: Action required by provider: clinical question for provider.  Patient was last seen in primary care on 11/09/2023 by Randeen Laine LABOR, MD.  Called Nurse Triage reporting Low BP.  Symptoms began several days ago.  Interventions attempted: Nothing.  Symptoms are: unchanged. Pt. Reports since BP medicine was increased he has had dizziness, low BP. Today 135/56. No availability for appointment. Please advise pt.  Triage Disposition: See HCP Within 4 Hours (Or PCP Triage)  Patient/caregiver understands and will follow disposition?: Yes   Copied from CRM #[id number]. Topic: Clinical - Red Word Triage >> Dec 10, 2023  2:36 PM Chasity T wrote: [facility] that prompted transfer to Nurse Triage: patient is calling because his blood pressure has been low for about a month. States 15 mins ago it read 135/56. He is currently taking hydrochlorothiazide  (HYDRODIURIL ) 25 MG tablet and he has been feeling light headed and dizzy Answer Assessment - Initial Assessment Questions 1. BLOOD PRESSURE: What is your blood pressure? Did you take at least two measurements 5 minutes apart?     135/56 2. ONSET: When did you take your blood pressure?     today 3. HOW: How did you take your blood pressure? (e.g., visiting nurse, automatic home BP monitor)     Home cuff 4. HISTORY: Do you have a history of low blood pressure? What is your blood pressure normally?     no 5. MEDICINES: Are you taking any medicines for blood pressure? If Yes, ask: Have they been changed recently?     yes 6. PULSE RATE: Do you know what your pulse rate is?      N/a 7. OTHER SYMPTOMS: Have you been sick recently? Have you had a recent injury?     no 8. PREGNANCY: Is there any chance you are pregnant? When was your last menstrual period?     N/a  Protocols used: Blood Pressure - Low-A-AH  Reason for Disposition  [1] Systolic BP 90-110 AND [2] taking blood pressure medications AND [3]  feeling weak or lightheaded  Answer Assessment - Initial Assessment Questions 1. BLOOD PRESSURE: What is your blood pressure? Did you take at least two measurements 5 minutes apart?     135/56 2. ONSET: When did you take your blood pressure?     today 3. HOW: How did you take your blood pressure? (e.g., visiting nurse, automatic home BP monitor)     Home cuff 4. HISTORY: Do you have a history of low blood pressure? What is your blood pressure normally?     no 5. MEDICINES: Are you taking any medicines for blood pressure? If Yes, ask: Have they been changed recently?     yes 6. PULSE RATE: Do you know what your pulse rate is?      N/a 7. OTHER SYMPTOMS: Have you been sick recently? Have you had a recent injury?     no 8. PREGNANCY: Is there any chance you are pregnant? When was your last menstrual period?     N/a  Protocols used: Blood Pressure - Low-A-AH

## 2023-12-11 NOTE — Telephone Encounter (Signed)
 What has been his average blood pressure because 135/65 is not low it is still slightly elevated.  Is he drinking enough water?  Hydrochlorothiazide  can dehydrate him so he should make sure he is taking in at least 80 oz daily.   Have him schedule a f/u appt to evaluate further.

## 2023-12-11 NOTE — Telephone Encounter (Signed)
 Spoke with pt. States that his BP is running about 128-135/60-70. He is drinking more than 80oz of water daily. He has been scheduled for a follow up with Tabitha on 12/25/23 at 0900. While speaking to the pt, he needs refills on amlodipine  and hydrochlorothiazide . Advised him that these were sent in back in May with a year's worth of refills. Pt will contact his pharmacy.

## 2023-12-25 ENCOUNTER — Ambulatory Visit (INDEPENDENT_AMBULATORY_CARE_PROVIDER_SITE_OTHER)
Admission: RE | Admit: 2023-12-25 | Discharge: 2023-12-25 | Disposition: A | Discharge status: Home / Self Care | Source: Ambulatory Visit | Attending: Family | Admitting: Family

## 2023-12-25 ENCOUNTER — Encounter: Payer: Self-pay | Admitting: Family

## 2023-12-25 ENCOUNTER — Ambulatory Visit: Payer: Self-pay | Admitting: Family

## 2023-12-25 ENCOUNTER — Ambulatory Visit (INDEPENDENT_AMBULATORY_CARE_PROVIDER_SITE_OTHER): Admitting: Family

## 2023-12-25 VITALS — BP 134/88 | HR 70 | Temp 98.4°F | Ht 69.0 in | Wt 208.2 lb

## 2023-12-25 DIAGNOSIS — R768 Other specified abnormal immunological findings in serum: Secondary | ICD-10-CM

## 2023-12-25 DIAGNOSIS — J301 Allergic rhinitis due to pollen: Secondary | ICD-10-CM

## 2023-12-25 DIAGNOSIS — M62838 Other muscle spasm: Secondary | ICD-10-CM

## 2023-12-25 DIAGNOSIS — K2100 Gastro-esophageal reflux disease with esophagitis, without bleeding: Secondary | ICD-10-CM

## 2023-12-25 DIAGNOSIS — E559 Vitamin D deficiency, unspecified: Secondary | ICD-10-CM | POA: Diagnosis not present

## 2023-12-25 DIAGNOSIS — M2550 Pain in unspecified joint: Secondary | ICD-10-CM | POA: Diagnosis not present

## 2023-12-25 DIAGNOSIS — M5412 Radiculopathy, cervical region: Secondary | ICD-10-CM

## 2023-12-25 DIAGNOSIS — R5383 Other fatigue: Secondary | ICD-10-CM | POA: Diagnosis not present

## 2023-12-25 DIAGNOSIS — R7401 Elevation of levels of liver transaminase levels: Secondary | ICD-10-CM

## 2023-12-25 DIAGNOSIS — G479 Sleep disorder, unspecified: Secondary | ICD-10-CM

## 2023-12-25 DIAGNOSIS — G4719 Other hypersomnia: Secondary | ICD-10-CM

## 2023-12-25 DIAGNOSIS — R0683 Snoring: Secondary | ICD-10-CM

## 2023-12-25 DIAGNOSIS — R140 Abdominal distension (gaseous): Secondary | ICD-10-CM

## 2023-12-25 DIAGNOSIS — I10 Essential (primary) hypertension: Secondary | ICD-10-CM

## 2023-12-25 DIAGNOSIS — K9049 Malabsorption due to intolerance, not elsewhere classified: Secondary | ICD-10-CM

## 2023-12-25 LAB — COMPREHENSIVE METABOLIC PANEL WITH GFR
ALT: 56 U/L — ABNORMAL HIGH (ref 0–53)
AST: 17 U/L (ref 0–37)
Albumin: 4.5 g/dL (ref 3.5–5.2)
Alkaline Phosphatase: 94 U/L (ref 39–117)
BUN: 12 mg/dL (ref 6–23)
CO2: 32 meq/L (ref 19–32)
Calcium: 9.5 mg/dL (ref 8.4–10.5)
Chloride: 99 meq/L (ref 96–112)
Creatinine, Ser: 0.9 mg/dL (ref 0.40–1.50)
GFR: 115.43 mL/min (ref >60.00)
Glucose, Bld: 86 mg/dL (ref 70–99)
Potassium: 3.9 meq/L (ref 3.5–5.1)
Sodium: 141 meq/L (ref 135–145)
Total Bilirubin: 0.6 mg/dL (ref 0.2–1.2)
Total Protein: 7.6 g/dL (ref 6.0–8.3)

## 2023-12-25 LAB — MICROALBUMIN / CREATININE URINE RATIO
Creatinine,U: 165.3 mg/dL
Microalb Creat Ratio: 5.6 mg/g (ref 0.0–30.0)
Microalb, Ur: 0.9 mg/dL (ref 0.0–1.9)

## 2023-12-25 LAB — MAGNESIUM: Magnesium: 2.2 mg/dL (ref 1.5–2.5)

## 2023-12-25 LAB — CBC
HCT: 45.3 % (ref 39.0–52.0)
Hemoglobin: 15.2 g/dL (ref 13.0–17.0)
MCHC: 33.5 g/dL (ref 30.0–36.0)
MCV: 87 fl (ref 78.0–100.0)
Platelets: 336 K/uL (ref 150.0–400.0)
RBC: 5.21 Mil/uL (ref 4.22–5.81)
RDW: 13.3 % (ref 11.5–15.5)
WBC: 7.5 K/uL (ref 4.0–10.5)

## 2023-12-25 LAB — VITAMIN D 25 HYDROXY (VIT D DEFICIENCY, FRACTURES): VITD: 25.78 ng/mL — ABNORMAL LOW (ref 30.00–100.00)

## 2023-12-25 LAB — SEDIMENTATION RATE: Sed Rate: 19 mm/h — ABNORMAL HIGH (ref 0–15)

## 2023-12-25 LAB — TSH: TSH: 1.33 u[IU]/mL (ref 0.35–5.50)

## 2023-12-25 LAB — VITAMIN B12: Vitamin B-12: 708 pg/mL (ref 211–911)

## 2023-12-25 LAB — C-REACTIVE PROTEIN: CRP: 1.1 mg/dL (ref 0.5–20.0)

## 2023-12-25 MED ORDER — CETIRIZINE HCL 10 MG PO TABS: 10.0000 mg | ORAL_TABLET | Freq: Every day | ORAL | 11 refills | Status: AC

## 2023-12-25 MED ORDER — TRAZODONE HCL 50 MG PO TABS: 25.0000 mg | ORAL_TABLET | Freq: Every evening | ORAL | 3 refills | Status: DC | PRN

## 2023-12-25 MED ORDER — METAXALONE 800 MG PO TABS: ORAL_TABLET | ORAL | 0 refills | Status: AC

## 2023-12-25 NOTE — Progress Notes (Signed)
 Established Patient Office Visit  Subjective:      CC:  Chief Complaint  Patient presents with   Hypertension    HPI: Matthew Wu is a 28 y.o. male presenting on 12/25/2023 for Hypertension .  Discussed the use of AI scribe software for clinical note transcription with the patient, who gave verbal consent to proceed.  History of Present Illness Matthew Wu is a 28 year old male who presents with muscle spasms and sleep disturbances.  He experiences muscle spasms, particularly when at rest, affecting his eyes, arms, and legs. These spasms are described as 'pulsing' or 'jerking' rather than painful cramping. He has tried tizanidine , which made him feel uncomfortable, even at a half tablet dose.  He continues to experience persistent pain in his neck, shoulder, and lower back, which has not improved since his last visit. Massage therapy provides temporary relief, but the pain persists, particularly in the neck and lower back, with some radiation to the right shoulder.  He reports significant sleep disturbances, characterized by difficulty staying asleep, waking up feeling hot and sweaty, and only achieving three to four hours of sleep per night. He feels excessively tired during the day and has been told by his partner that he snores. He has not had a sleep study before.  He mentions feeling dehydrated despite drinking plenty of water and electrolyte drinks. His urine remains dark yellow, and he works in a cold environment, primarily in a store cooler.  He experiences joint stiffness and pain, particularly in his legs, neck, and lower back. He describes a history of working two jobs, including one at [employer], which involved heavy lifting and may have contributed to his current symptoms.  He is currently taking hydrochlorothiazide  25 mg and amlodipine  10 mg for blood pressure management. His blood pressure readings are generally stable, with occasional low diastolic  readings.  He has experienced tremors in his hands, which he associates with lack of sleep and recent sinus issues. He reports chronic allergy  symptoms, including postnasal drip and sinus congestion, which exacerbate his symptoms. He has not been taking any regular allergy  medication.         Social history:  Relevant past medical, surgical, family and social history reviewed and updated as indicated. Interim medical history since our last visit reviewed.  Allergies and medications reviewed and updated.  DATA REVIEWED: CHART IN EPIC     ROS: Negative unless specifically indicated above in HPI.    Current Outpatient Medications:    aluminum-magnesium hydroxide 200-200 MG/5ML suspension, Take by mouth every 6 (six) hours as needed for indigestion., Disp: , Rfl:    amLODipine  (NORVASC ) 10 MG tablet, Take 1 tablet (10 mg total) by mouth daily., Disp: 90 tablet, Rfl: 3   cetirizine  (ZYRTEC ) 10 MG tablet, Take 1 tablet (10 mg total) by mouth daily., Disp: 30 tablet, Rfl: 11   famotidine  (PEPCID ) 20 MG tablet, Take 1 tablet (20 mg total) by mouth 2 (two) times daily., Disp: , Rfl:    hydrochlorothiazide  (HYDRODIURIL ) 25 MG tablet, Take 1 tab daily in the morning for blood pressure goal < 130/70, Disp: 90 tablet, Rfl: 3   metaxalone  (SKELAXIN ) 800 MG tablet, Take 1/2 to one tablet po at bedtime prn muscle relaxer, Disp: 20 tablet, Rfl: 0   pantoprazole  (PROTONIX ) 40 MG tablet, TAKE 1 TABLET BY MOUTH 2 TIMES A DAY, Disp: 180 tablet, Rfl: 0   traZODone  (DESYREL ) 50 MG tablet, Take 0.5-1 tablets (25-50 mg total) by mouth at  bedtime as needed for sleep., Disp: 30 tablet, Rfl: 3        Objective:        BP 134/88 (BP Location: Right Arm, Patient Position: Sitting, Cuff Size: Large)   Pulse 70   Temp 98.4 F (36.9 C) (Temporal)   Ht 5' 9 (1.753 m)   Wt 208 lb 3.2 oz (94.4 kg)   SpO2 99%   BMI 30.75 kg/m   Physical Exam NECK: Pain with neck flexion. No pain with neck  hyperextension. Pain on right with neck rotation. Negative Spurling's test.muscle spasm evidenced on left lower posterior neck with tenderness  NEUROLOGICAL: Good grip strength bilaterally. No edema noted bil hands   Wt Readings from Last 3 Encounters:  12/25/23 208 lb 3.2 oz (94.4 kg)  11/09/23 209 lb 6 oz (95 kg)  09/23/23 210 lb (95.3 kg)           Results   Assessment & Plan:   Assessment and Plan Assessment & Plan Chronic neck, right shoulder, and low back pain with muscle spasm Persistent pain in the neck, right shoulder, and low back with associated muscle spasms, exacerbated by certain movements. Possible pinched nerve suspected due to radiating pain. No prior imaging conducted. - Order neck x-ray to evaluate for pinched nerve or other structural issues. - Prescribe Skelaxin  (metaxalone ) as a muscle relaxant, starting with half a tablet due to sensitivity. - Recommend use of heating pad at night to relieve muscle spasms. - Provide neck exercises to improve flexibility and strength. - Encourage use of massage therapy for temporary relief.  Chronic insomnia with excessive daytime sleepiness and suspected obstructive sleep apnea Chronic insomnia with frequent awakenings and excessive daytime sleepiness. Suspected obstructive sleep apnea due to snoring and daytime fatigue. No prior sleep study conducted. - Order at-home sleep study to evaluate for obstructive sleep apnea. - Prescribe trazodone  for sleep, starting with half a tablet due to sensitivity. - Advise against taking trazodone  and muscle relaxant on the same night.  Primary hypertension Blood pressure is well-controlled with current medication regimen. Occasional low diastolic readings noted but not concerning given current systolic levels.  Muscle spasms and tremor Intermittent muscle spasms and tremors, possibly related to electrolyte imbalance or lack of sleep. - Recommend magnesium supplementation, starting  with 500 mg tablet if tolerated. - Advise to monitor for diarrhea as a sign of excessive magnesium intake.  Allergic rhinitis Chronic allergy  symptoms including itchy, watery eyes and postnasal drip, suggestive of allergic rhinitis. - Recommend daily Zyrtec  or Xyzal for allergy  management. - Consider adding Flonase  if symptoms persist after a week of antihistamine use.  Recurrent viral upper respiratory infection with sinus symptoms Frequent sinus symptoms and postnasal drip, likely due to recurrent viral infections, exacerbated by allergies. - Recommend Coricidin HBP for decongestion, avoiding products with dextromethorphan. - Advise daily allergy  medication to prevent sinus symptoms.  Recording duration: 20 minutes      Return in about 3 months (around 03/26/2024) for f/u CPE.     Ginger Patrick, MSN, APRN, FNP-C [facility]

## 2023-12-25 NOTE — Patient Instructions (Signed)
   VISIT SUMMARY: During your visit, we discussed your muscle spasms, sleep disturbances, chronic pain, and other symptoms. We reviewed your current medications and made some adjustments to help manage your conditions more effectively.  YOUR PLAN: -CHRONIC NECK, RIGHT SHOULDER, AND LOW BACK PAIN WITH MUSCLE SPASM: You have persistent pain in your neck, right shoulder, and lower back, along with muscle spasms. This could be due to a pinched nerve. We will do a neck x-ray to check for any structural issues. You will start taking Skelaxin  as a muscle relaxant, beginning with half a tablet. Use a heating pad at night and perform the neck exercises provided. Continue with massage therapy for temporary relief.  -CHRONIC INSOMNIA WITH EXCESSIVE DAYTIME SLEEPINESS AND SUSPECTED OBSTRUCTIVE SLEEP APNEA: You have trouble sleeping and feel very tired during the day, which may be due to obstructive sleep apnea. We will conduct an at-home sleep study to investigate this. You will start taking trazodone  for sleep, beginning with half a tablet. Do not take trazodone  and the muscle relaxant on the same night.  -PRIMARY HYPERTENSION: Your blood pressure is well-controlled with your current medications, hydrochlorothiazide  and amlodipine . Occasional low diastolic readings are not concerning at this time.  -MUSCLE SPASMS AND TREMOR: You experience muscle spasms and tremors, possibly due to an electrolyte imbalance or lack of sleep. Start taking a 500 mg magnesium tablet if you can tolerate it. Watch for diarrhea, which can indicate too much magnesium.  -ALLERGIC RHINITIS: You have chronic allergy  symptoms like itchy, watery eyes and postnasal drip. Start taking a daily antihistamine like Zyrtec  or Xyzal. If symptoms persist after a week, consider adding Flonase .  -RECURRENT VIRAL UPPER RESPIRATORY INFECTION WITH SINUS SYMPTOMS: You have frequent sinus symptoms and postnasal drip, likely due to viral infections worsened  by allergies. Take Coricidin HBP for decongestion and avoid products with dextromethorphan. Continue daily allergy  medication to prevent sinus symptoms.  INSTRUCTIONS: Please follow up after your neck x-ray and at-home sleep study to discuss the results and next steps.

## 2023-12-26 LAB — RHEUMATOID FACTOR: Rheumatoid fact SerPl-aCnc: <10 [IU]/mL (ref <14)

## 2023-12-28 LAB — ENA+DNA/DS+ANTICH+CENTRO+FA...
Anti JO-1: <0.2 AI (ref 0.0–0.9)
Antiribosomal P Antibodies: <0.2 AI (ref 0.0–0.9)
Centromere Ab Screen: <0.2 AI (ref 0.0–0.9)
Chromatin Ab SerPl-aCnc: <0.2 AI (ref 0.0–0.9)
ENA RNP Ab: 0.2 AI (ref 0.0–0.9)
ENA SM Ab Ser-aCnc: <0.2 AI (ref 0.0–0.9)
ENA SSA (RO) Ab: 0.2 AI (ref 0.0–0.9)
ENA SSB (LA) Ab: <0.2 AI (ref 0.0–0.9)
Scleroderma (Scl-70) (ENA) Antibody, IgG: <0.2 AI (ref 0.0–0.9)
Smith/RNP Antibodies: <0.2 AI (ref 0.0–0.9)
Speckled Pattern: 1:80 {titer}
dsDNA Ab: <1 [IU]/mL (ref 0–9)

## 2023-12-28 LAB — ANA W/REFLEX: ANA Titer 1: POSITIVE — AB

## 2023-12-28 MED ORDER — CHOLECALCIFEROL 1.25 MG (50000 UT) PO TABS: 1.0000 | ORAL_TABLET | ORAL | 0 refills | Status: AC

## 2023-12-31 ENCOUNTER — Other Ambulatory Visit (INDEPENDENT_AMBULATORY_CARE_PROVIDER_SITE_OTHER)

## 2023-12-31 DIAGNOSIS — K2100 Gastro-esophageal reflux disease with esophagitis, without bleeding: Secondary | ICD-10-CM

## 2023-12-31 DIAGNOSIS — R140 Abdominal distension (gaseous): Secondary | ICD-10-CM | POA: Diagnosis not present

## 2023-12-31 DIAGNOSIS — K9049 Malabsorption due to intolerance, not elsewhere classified: Secondary | ICD-10-CM

## 2024-01-04 ENCOUNTER — Encounter: Payer: Self-pay | Admitting: *Deleted

## 2024-01-04 LAB — CELIAC DISEASE PANEL
(tTG) Ab, IgA: <1 U/mL
(tTG) Ab, IgG: <1 U/mL
Gliadin IgA: <1 U/mL
Gliadin IgG: <1 U/mL
Immunoglobulin A: 348 mg/dL — ABNORMAL HIGH (ref 47–310)

## 2024-01-05 ENCOUNTER — Other Ambulatory Visit: Payer: Self-pay

## 2024-01-05 ENCOUNTER — Encounter: Payer: Self-pay | Admitting: Intensive Care

## 2024-01-05 ENCOUNTER — Emergency Department
Admission: EM | Admit: 2024-01-05 | Discharge: 2024-01-05 | Disposition: A | Discharge status: Home / Self Care | Attending: Emergency Medicine | Admitting: Emergency Medicine

## 2024-01-05 DIAGNOSIS — S61012A Laceration without foreign body of left thumb without damage to nail, initial encounter: Secondary | ICD-10-CM | POA: Insufficient documentation

## 2024-01-05 DIAGNOSIS — S6992XA Unspecified injury of left wrist, hand and finger(s), initial encounter: Secondary | ICD-10-CM | POA: Diagnosis present

## 2024-01-05 DIAGNOSIS — W268XXA Contact with other sharp object(s), not elsewhere classified, initial encounter: Secondary | ICD-10-CM | POA: Diagnosis not present

## 2024-01-05 DIAGNOSIS — Y93H1 Activity, digging, shoveling and raking: Secondary | ICD-10-CM | POA: Insufficient documentation

## 2024-01-05 NOTE — ED Notes (Signed)
 See triage note  Presents with laceration to left thumb  States he was messing in tool shed  Hit something which caused the cut to his thumb

## 2024-01-05 NOTE — ED Triage Notes (Signed)
 Patient presents with laceration to left hand, thumb.

## 2024-01-05 NOTE — ED Provider Notes (Signed)
 [facility] Provider Note   Event Date/Time   First MD Initiated Contact with Patient 01/05/24 1149     (approximate) History  Extremity Laceration  HPI Matthew Wu is a 28 y.o. male with no stated relevant past medical history who presents complaining of a left thumb laceration that he sustained while digging in a toolbox.  Patient states he does not know what caught him and did not look at the laceration.  Patient states that he wrapped it in a Band-Aid and paper towel and presented to the emergency department.  Patient does endorse recent tetanus vaccination 1 year prior to arrival.  Patient denies any history of blood thinner use or bleeding/clotting disorders ROS: Patient denies any distal paresthesia, numbness, or discoloration to his finger   Physical Exam  Triage Vital Signs: ED Triage Vitals  Encounter Vitals Group     BP 01/05/24 1140 (!) 171/98     Girls Systolic BP Percentile --      Girls Diastolic BP Percentile --      Boys Systolic BP Percentile --      Boys Diastolic BP Percentile --      Pulse Rate 01/05/24 1140 80     Resp 01/05/24 1140 15     Temp 01/05/24 1140 98.3 F (36.8 C)     Temp Source 01/05/24 1140 Oral     SpO2 01/05/24 1140 99 %     Weight 01/05/24 1141 209 lb (94.8 kg)     Height 01/05/24 1141 5' 9 (1.753 m)     Head Circumference --      Peak Flow --      Pain Score 01/05/24 1140 3     Pain Loc --      Pain Education --      Exclude from Growth Chart --    Most recent vital signs: Vitals:   01/05/24 1140  BP: (!) 171/98  Pulse: 80  Resp: 15  Temp: 98.3 F (36.8 C)  SpO2: 99%   General: Awake, oriented x4. CV:  Good peripheral perfusion. Resp:  Normal effort. Abd:  No distention. Other:  Middle-aged African-American male resting comfortably in no acute distress.  There is a 1 cm x 5 mm area of deep abrasion to the medial aspect of the palmar side of the left distal thumb that is oozing blood ED Results /  Procedures / Treatments  Labs (all labs ordered are listed, but only abnormal results are displayed) Labs Reviewed - No data to display PROCEDURES: Critical Care performed: No Procedures MEDICATIONS ORDERED IN ED: Medications - No data to display IMPRESSION / MDM / ASSESSMENT AND PLAN / ED COURSE  I reviewed the triage vital signs and the nursing notes.                             The patient is on the cardiac monitor to evaluate for evidence of arrhythmia and/or significant heart rate changes. Patient's presentation is most consistent with acute presentation with potential threat to life or bodily function. Patient is a 28 year old male who presents for a laceration to the left thumb.  This injury is more of an avulsion than a laceration and therefore upon cleaning to the base, there was no evidence of foreign body.  Patient's thumb was soaked in an iodine solution for approximately 25 minutes and then irrigated at bedside.  There was no closure necessary as this was a  large skin avulsion.  Patient is up-to-date on his tetanus and given strict return precautions as well as laceration care instructions prior to discharge.  Patient expressed understanding and all questions were answered prior to discharge  Dispo: Discharge home with PCP follow-up   FINAL CLINICAL IMPRESSION(S) / ED DIAGNOSES   Final diagnoses:  Laceration of left thumb without foreign body without damage to nail, initial encounter   Rx / DC Orders   ED Discharge Orders     None      Note:  This document was prepared using Dragon voice recognition software and may include unintentional dictation errors.   Emir Nack K, MD 01/05/24 1230

## 2024-01-07 ENCOUNTER — Ambulatory Visit: Payer: Self-pay | Admitting: Family

## 2024-01-07 DIAGNOSIS — K5909 Other constipation: Secondary | ICD-10-CM

## 2024-01-07 DIAGNOSIS — K2100 Gastro-esophageal reflux disease with esophagitis, without bleeding: Secondary | ICD-10-CM

## 2024-01-07 DIAGNOSIS — R195 Other fecal abnormalities: Secondary | ICD-10-CM

## 2024-01-07 DIAGNOSIS — K760 Fatty (change of) liver, not elsewhere classified: Secondary | ICD-10-CM

## 2024-01-07 DIAGNOSIS — R1114 Bilious vomiting: Secondary | ICD-10-CM

## 2024-01-09 LAB — CALPROTECTIN: Calprotectin: 502 ug/g — ABNORMAL HIGH

## 2024-01-13 ENCOUNTER — Encounter: Payer: Self-pay | Admitting: Family Medicine

## 2024-01-13 NOTE — Telephone Encounter (Signed)
 Matthew Wu,   This pt is scheduled out to November, is this the soonest he can be seen at your clinic with an urgent referral? He has worsening abdominal pain, painful stools and rectal pressure, nausea vomiting and elevated calprotectin also blood in stool at times. Please let me know if there is anything sooner for consult.

## 2024-01-15 NOTE — Progress Notes (Addendum)
 01/19/2024 Nox Talent [id number] 06/22/1995  Referring provider: Corwin Antu, FNP Primary GI doctor: Dr. Suzann  ASSESSMENT AND PLAN:  AB pain epigastric, bloating, nausea, dizziness, fatigue, diarrhea with greasy/bile stools Normally lasts 1-2 weeks every several months but this last flare was 1 month Worse with meats, spicy foods, onions June/July flare of symptom, strep throat s/p antibiotics May.  2024 negative celiac, fecal calprotectin and fecal fat, negative alpha gal 01/2023 CTAP W normal Repeat fecal calprotectin 12/31/2023 502, CRP/Sed rate normal -Get Sed rate, CRP - Order abdominal ultrasound for gallbladder and liver assessment. - Order stool studies for infection: Giardia, C. diff, ova, parasites. - If stool studies negative, schedule colonoscopy and CT enterography to rule out Crohn's disease with elevated fecal calprotectin. - Provided information on gastroparesis and SIBO, including dietary modifications. - Prescribe dicyclomine  for spasms. -Get lipase - SIBO testing or xifaxin trial pending results - consider GES -Can do trial of IBGARD daily, will give Bentyl  -FODMAP,  and lifestyle changes discussed  GERD 12/2022 negative H. pylori breath test 12/2022 ABUS hepatic steatosis otherwise unremarkable 01/2023 HIDA scan normal 01/2023 CTAP W normal 03/2023 EGD with Dr. Edith normal esophagus status post dilation, normal stomach duodenum biopsies negative for EOE or reflux. -Continue medications -Consider SIBO testing -Consider repeat EGD pending labs versus pH/manamatry  IBS with diarrhea Previous negative celiac, fecal calprotectin and fecal fat, negative alpha gal 2023 Repeat fecal calprotectin 12/31/2023 502, CRP/Sed rate normal  Wt Readings from Last 10 Encounters:  01/19/24 207 lb 4 oz (94 kg)  01/05/24 209 lb (94.8 kg)  12/25/23 208 lb 3.2 oz (94.4 kg)  11/09/23 209 lb 6 oz (95 kg)  09/23/23 210 lb (95.3 kg)  09/10/23 208 lb (94.3 kg)   03/26/23 203 lb 9.6 oz (92.4 kg)  02/25/23 203 lb (92.1 kg)  02/18/23 202 lb 9.6 oz (91.9 kg)  01/06/23 198 lb 9.6 oz (90.1 kg)   I have reviewed the clinic note as outlined by Alan Coombs, PA and agree with the assessment, plan and medical decision making.  Kaci is seen in the office today for evaluation of abdominal pain, bloating, diarrhea and nausea.  He has had a previous extensive evaluation including laboratory tests, H. pylori breath testing, AUS, HIDA, CTAP and EGD.  These have all been essentially normal.  Recently had elevated fecal calprotectin of 502.  Normal ESR and CRP.  I agree with obtaining stool studies to rule out enteric pathogens.  If these are negative reasonable to proceed with colonoscopy and consideration of CT enterography.  May use IBgard and dicyclomine  for symptomatic relief in the short-term.  Inocente Suzann, MD  Patient Care Team: Corwin Antu, FNP as PCP - General (Family Medicine)  HISTORY OF PRESENT ILLNESS: 28 y.o. male with a past medical history listed below presents as a new patient for evaluation of abdominal pain with fecal calprotectin  Previously seen by [facility] gastroenterology, last seen in the office 02/18/2023 for GERD, IBS diarrhea  Discussed the use of AI scribe software for clinical note transcription with the patient, who gave verbal consent to proceed.  History of Present Illness   Chee Kinslow is a 28 year old male who presents with abdominal pain, nausea, and changes in bowel habits.  He has experienced gastrointestinal symptoms since 2019, with episodes where food feels stuck, accompanied by nausea, bloating, and abdominal pain. These symptoms have been persistent since June or July 2025, with the current flare lasting over a month, longer than  previous episodes that typically lasted one to two weeks.  He experiences nausea, dizziness, fatigue, and changes in bowel habits. During flares, he has diarrhea with loose stools  occurring five to six times a day, and at other times, he feels constipated with minimal bowel movements. His stools can be mucousy, greasy, and vary in color from yellow to dark. He also experiences sharp abdominal pain and bloating.  He has a history of being treated with omeprazole  and pantoprazole  for acid reflux, but these medications are no longer effective. He also takes Pepcid  as needed. He avoids certain foods such as spicy foods, sodas, acidic fruits, and sugary items, as they exacerbate his symptoms. He reports weight loss due to dietary restrictions during flares.  His symptoms worsen with illnesses such as sinus infections or fevers. He recalls a significant illness in December 2019, which he suspects might have been COVID-19, coinciding with the onset of his gastrointestinal issues.  No family history of gallbladder issues or autoimmune diseases like Crohn's or ulcerative colitis. He has not had a colonoscopy but has undergone an endoscopy and stool testing. Previous diagnostic tests, including a HIDA scan and CT scan, did not reveal significant findings. His fecal calprotectin was elevated recently, although CRP and sed rate were normal.  He experiences muscle spasms, particularly in his stomach area, and has joint pain. He is currently taking metolazone for neck spasms unrelated to his gastrointestinal issues.      He  reports that he has never smoked. He has never used smokeless tobacco. He reports that he does not currently use alcohol. He reports that he does not use drugs.  RELEVANT GI HISTORY, IMAGING AND LABS: Results   LABS Fecal calprotectin: elevated C-reactive protein (CRP): negative Erythrocyte sedimentation rate (ESR): negative  RADIOLOGY Abdominal CT: no abnormalities (01/2023) Abdominal ultrasound: fatty liver (12/2022)  DIAGNOSTIC Esophagogastroduodenoscopy (EGD): normal  PATHOLOGY Stomach biopsy: normal      CBC    Component Value Date/Time   WBC 7.5  12/25/2023 0930   RBC 5.21 12/25/2023 0930   HGB 15.2 12/25/2023 0930   HCT 45.3 12/25/2023 0930   PLT 336.0 12/25/2023 0930   MCV 87.0 12/25/2023 0930   MCH 29.7 09/10/2023 1032   MCHC 33.5 12/25/2023 0930   RDW 13.3 12/25/2023 0930   LYMPHSABS 2.4 01/06/2023 1244   MONOABS 0.6 01/06/2023 1244   EOSABS 0.1 01/06/2023 1244   BASOSABS 0.0 01/06/2023 1244   Recent Labs    09/10/23 1032 12/25/23 0930  HGB 16.0 15.2    CMP     Component Value Date/Time   NA 141 12/25/2023 0930   K 3.9 12/25/2023 0930   CL 99 12/25/2023 0930   CO2 32 12/25/2023 0930   GLUCOSE 86 12/25/2023 0930   BUN 12 12/25/2023 0930   CREATININE 0.90 12/25/2023 0930   CREATININE 0.94 05/07/2020 1713   CALCIUM 9.5 12/25/2023 0930   PROT 7.6 12/25/2023 0930   PROT 7.9 02/18/2023 1105   ALBUMIN 4.5 12/25/2023 0930   ALBUMIN 4.7 02/18/2023 1105   AST 17 12/25/2023 0930   ALT 56 (H) 12/25/2023 0930   ALKPHOS 94 12/25/2023 0930   BILITOT 0.6 12/25/2023 0930   BILITOT 0.6 02/18/2023 1105   GFRNONAA >60 09/10/2023 1032   GFRNONAA 112 05/07/2020 1713   GFRAA 130 05/07/2020 1713      Latest Ref Rng & Units 12/25/2023    9:30 AM 02/18/2023   11:05 AM 05/07/2020    5:13 PM  Hepatic Function  Total Protein 6.0 - 8.3 g/dL 7.6  7.9  7.2   Albumin 3.5 - 5.2 g/dL 4.5  4.7    AST 0 - 37 U/L 17  22  15    ALT 0 - 53 U/L 56  49  32   Alk Phosphatase 39 - 117 U/L 94  119    Total Bilirubin 0.2 - 1.2 mg/dL 0.6  0.6  0.3   Bilirubin, Direct 0.00 - 0.40 mg/dL  9.84        Current Medications:    Current Outpatient Medications (Cardiovascular):    amLODipine  (NORVASC ) 10 MG tablet, Take 1 tablet (10 mg total) by mouth daily.   hydrochlorothiazide  (HYDRODIURIL ) 25 MG tablet, Take 1 tab daily in the morning for blood pressure goal < 130/70  Current Outpatient Medications (Respiratory):    cetirizine  (ZYRTEC ) 10 MG tablet, Take 1 tablet (10 mg total) by mouth daily.    Current Outpatient Medications (Other):     aluminum-magnesium hydroxide 200-200 MG/5ML suspension, Take by mouth every 6 (six) hours as needed for indigestion.   Cholecalciferol  1.25 MG (50000 UT) TABS, Take 1 tablet by mouth once a week.   dicyclomine  (BENTYL ) 20 MG tablet, Take 1 tablet (20 mg total) by mouth 3 (three) times daily as needed for spasms.   famotidine  (PEPCID ) 20 MG tablet, Take 1 tablet (20 mg total) by mouth 2 (two) times daily.   metaxalone  (SKELAXIN ) 800 MG tablet, Take 1/2 to one tablet po at bedtime prn muscle relaxer   pantoprazole  (PROTONIX ) 40 MG tablet, TAKE 1 TABLET BY MOUTH 2 TIMES A DAY   traZODone  (DESYREL ) 50 MG tablet, Take 0.5-1 tablets (25-50 mg total) by mouth at bedtime as needed for sleep.  Medical History:  Past Medical History:  Diagnosis Date   Asthma    GERD (gastroesophageal reflux disease)    Hyperlipidemia    Hypertension    Allergies:  Allergies  Allergen Reactions   Pollen Extract     Other reaction(s): eye redness   Robitussin (Alcohol Free) [Guaifenesin] Nausea And Vomiting     Surgical History:  He  has a past surgical history that includes No past surgeries; Esophagogastroduodenoscopy (egd) with propofol  (N/A, 03/26/2023); and biopsy (03/26/2023). Family History:  His family history includes Dementia in his maternal grandfather; Heart attack (age of onset: 49) in his maternal grandmother; Hypertension in his maternal grandmother; Stroke in his mother.  REVIEW OF SYSTEMS  : All other systems reviewed and negative except where noted in the History of Present Illness.  PHYSICAL EXAM: BP (!) 140/70   Pulse 90   Ht 5' 7 (1.702 m)   Wt 207 lb 4 oz (94 kg)   BMI 32.46 kg/m  Physical Exam   GENERAL APPEARANCE: Well nourished, in no apparent distress. HEENT: No cervical lymphadenopathy, unremarkable thyroid , sclerae anicteric, conjunctiva pink. RESPIRATORY: Respiratory effort normal, breath sounds equal bilaterally without rales, rhonchi, or wheezing. CARDIO: Regular rate  and rhythm with no murmurs, rubs, or gallops, peripheral pulses intact. ABDOMEN: Soft, non-distended, decreased bowel sounds, non-tender to palpation, normal to auscultation, no rebound, no mass appreciated. RECTAL: Declines. MUSCULOSKELETAL: Full range of motion, normal gait, without edema. SKIN: Dry, intact without rashes or lesions. No jaundice. NEURO: Alert, oriented, no focal deficits. PSYCH: Cooperative, normal mood and affect.      Alan JONELLE Coombs, PA-C 10:07 AM

## 2024-01-19 ENCOUNTER — Encounter: Payer: Self-pay | Admitting: Physician Assistant

## 2024-01-19 ENCOUNTER — Ambulatory Visit: Admitting: Physician Assistant

## 2024-01-19 ENCOUNTER — Ambulatory Visit (INDEPENDENT_AMBULATORY_CARE_PROVIDER_SITE_OTHER)
Admission: RE | Admit: 2024-01-19 | Discharge: 2024-01-19 | Disposition: A | Discharge status: Home / Self Care | Source: Ambulatory Visit | Attending: Physician Assistant | Admitting: Physician Assistant

## 2024-01-19 ENCOUNTER — Other Ambulatory Visit (INDEPENDENT_AMBULATORY_CARE_PROVIDER_SITE_OTHER)

## 2024-01-19 VITALS — BP 140/70 | HR 90 | Ht 67.0 in | Wt 207.2 lb

## 2024-01-19 DIAGNOSIS — R197 Diarrhea, unspecified: Secondary | ICD-10-CM

## 2024-01-19 DIAGNOSIS — R195 Other fecal abnormalities: Secondary | ICD-10-CM

## 2024-01-19 DIAGNOSIS — K580 Irritable bowel syndrome with diarrhea: Secondary | ICD-10-CM

## 2024-01-19 DIAGNOSIS — R1013 Epigastric pain: Secondary | ICD-10-CM

## 2024-01-19 DIAGNOSIS — K219 Gastro-esophageal reflux disease without esophagitis: Secondary | ICD-10-CM | POA: Diagnosis not present

## 2024-01-19 DIAGNOSIS — K760 Fatty (change of) liver, not elsewhere classified: Secondary | ICD-10-CM

## 2024-01-19 LAB — CBC WITH DIFFERENTIAL/PLATELET
Basophils Absolute: 0.1 K/uL (ref 0.0–0.1)
Basophils Relative: 0.7 % (ref 0.0–3.0)
Eosinophils Absolute: 0.2 K/uL (ref 0.0–0.7)
Eosinophils Relative: 2.6 % (ref 0.0–5.0)
HCT: 43.3 % (ref 39.0–52.0)
Hemoglobin: 14.6 g/dL (ref 13.0–17.0)
Lymphocytes Relative: 34 % (ref 12.0–46.0)
Lymphs Abs: 2.5 K/uL (ref 0.7–4.0)
MCHC: 33.7 g/dL (ref 30.0–36.0)
MCV: 86.5 fl (ref 78.0–100.0)
Monocytes Absolute: 0.7 K/uL (ref 0.1–1.0)
Monocytes Relative: 9.2 % (ref 3.0–12.0)
Neutro Abs: 3.9 K/uL (ref 1.4–7.7)
Neutrophils Relative %: 53.5 % (ref 43.0–77.0)
Platelets: 298 K/uL (ref 150.0–400.0)
RBC: 5 Mil/uL (ref 4.22–5.81)
RDW: 13.3 % (ref 11.5–15.5)
WBC: 7.3 K/uL (ref 4.0–10.5)

## 2024-01-19 LAB — COMPREHENSIVE METABOLIC PANEL WITH GFR
ALT: 31 U/L (ref 0–53)
AST: 14 U/L (ref 0–37)
Albumin: 4.5 g/dL (ref 3.5–5.2)
Alkaline Phosphatase: 90 U/L (ref 39–117)
BUN: 12 mg/dL (ref 6–23)
CO2: 30 meq/L (ref 19–32)
Calcium: 9.2 mg/dL (ref 8.4–10.5)
Chloride: 101 meq/L (ref 96–112)
Creatinine, Ser: 1.04 mg/dL (ref 0.40–1.50)
GFR: 97 mL/min (ref >60.00)
Glucose, Bld: 78 mg/dL (ref 70–99)
Potassium: 3.5 meq/L (ref 3.5–5.1)
Sodium: 142 meq/L (ref 135–145)
Total Bilirubin: 0.4 mg/dL (ref 0.2–1.2)
Total Protein: 8 g/dL (ref 6.0–8.3)

## 2024-01-19 LAB — SEDIMENTATION RATE: Sed Rate: 10 mm/h (ref 0–15)

## 2024-01-19 LAB — TSH: TSH: 1.31 u[IU]/mL (ref 0.35–5.50)

## 2024-01-19 LAB — HIGH SENSITIVITY CRP: CRP, High Sensitivity: 2.83 mg/L (ref 0.000–5.000)

## 2024-01-19 MED ORDER — DICYCLOMINE HCL 20 MG PO TABS: 20.0000 mg | ORAL_TABLET | Freq: Three times a day (TID) | ORAL | 0 refills | Status: DC | PRN

## 2024-01-19 NOTE — Patient Instructions (Addendum)
 Your provider has requested that you go to the basement level for lab work before leaving today. Press B on the elevator. The lab is located at the first door on the left as you exit the elevator.  Due to recent changes in healthcare laws, you may see the results of your imaging and laboratory studies on MyChart before your provider has had a chance to review them.  We understand that in some cases there may be results that are confusing or concerning to you. Not all laboratory results come back in the same time frame and the provider may be waiting for multiple results in order to interpret others.  Please give us  48 hours in order for your provider to thoroughly review all the results before contacting the office for clarification of your results.    Your provider has requested that you have an abdominal x ray before leaving today. Please go to the basement floor to our Radiology department for the test.  You have been scheduled for an abdominal ultrasound at [facility] (1st floor of hospital) on 01/25/24 at 9:30 am. Please arrive 15 minutes prior to your appointment for registration. Make certain not to have anything to eat or drink after midnight prior to your appointment. Should you need to reschedule your appointment, please contact radiology at [phone]. This test typically takes about 30 minutes to perform.  First do a trial off milk/lactose products if you use them.  Can send in an anti spasm medication, Bentyl , to take as needed.   Thank you for trusting me with your gastrointestinal care!   Alan Coombs, PA-C   _______________________________________________________  If your blood pressure at your visit was 140/90 or greater, please contact your primary care physician to follow up on this.  _______________________________________________________  If you are age 80 or older, your body mass index should be between 23-30. Your Body mass index is 32.46 kg/m. If this  is out of the aforementioned range listed, please consider follow up with your Primary Care Provider.  If you are age 27 or younger, your body mass index should be between 19-25. Your Body mass index is 32.46 kg/m. If this is out of the aformentioned range listed, please consider follow up with your Primary Care Provider.   ________________________________________________________  The [facility] providers would like to encourage you to use MYCHART to communicate with providers for non-urgent requests or questions.  Due to long hold times on the telephone, sending your provider a message by [facility] may be a faster and more efficient way to get a response.  Please allow 48 business hours for a response.  Please remember that this is for non-urgent requests.  _______________________________________________________  [facility] is using a team-based approach to care.  Your team is made up of your doctor and two to three APPS. Our APPS (Nurse Practitioners and Physician Assistants) work with your physician to ensure care continuity for you. They are fully qualified to address your health concerns and develop a treatment plan. They communicate directly with your gastroenterologist to care for you. Seeing the Advanced Practice Practitioners on your physician's team can help you by facilitating care more promptly, often allowing for earlier appointments, access to diagnostic testing, procedures, and other specialty referrals.      FODMAP stands for fermentable oligo-, di-, mono-saccharides and polyols (1). These are the scientific terms used to classify groups of carbs that are difficult for our body to digest and that are notorious for triggering digestive symptoms  like bloating, gas, loose stools and stomach pain.   You can try low FODMAP diet  - start with eliminating just one column at a time that you feel may be a trigger for you. - the table at the very bottom contains foods that are  low in FODMAPs   Sometimes trying to eliminate the FODMAP's from your diet is difficult or tricky, if you are stuggling with trying to do the elimination diet you can try an enzyme.  There is a food enzymes that you sprinkle in or on your food that helps break down the FODMAP. You can read more about the enzyme by going to this site: https://fodzyme.com/    Gastroparesis Gastroparesis is a condition in which food takes longer than normal to empty from the stomach.  This condition is also known as delayed gastric emptying. It is usually a long-term (chronic) condition.  What are the signs or symptoms? Symptoms of this condition include: Feeling full after eating very little or a loss of appetite. Nausea, vomiting, or heartburn. Bloating of your abdomen. Inconsistent blood sugar (glucose) levels on blood tests. Unexplained weight loss. Acid from the stomach coming up into the esophagus (gastroesophageal reflux). Sudden tightening (spasm) of the stomach, which can be painful. Symptoms may come and go. Some people may not notice any symptoms.  What increases the risk? You are more likely to develop this condition if: You have certain disorders or diseases. These may include: An endocrine disorder. An eating disorder. Amyloidosis. Scleroderma. Parkinson's disease. Multiple sclerosis. Cancer or infection of the stomach or the vagus nerve. You have had surgery on your stomach or vagus nerve. You take certain medicines. You are male.  Things you can do: Please do small frequent meals like 4-6 meals a day.  Eat and drink liquids at separate times.  Avoid high fiber foods, cook your vegetables, avoid high fat food.  Suggest spreading protein throughout the day (greek yogurt, glucerna, soft meat, milk, eggs) Choose soft foods that you can mash with a fork When you are more symptomatic, change to pureed foods foods and liquids.  Consider reading Living well with Gastroparesis by  [facility] Check out this link to a diet online https://my.GroupJournal.fr   Small intestinal bacterial overgrowth (SIBO) occurs when there is an abnormal increase in the overall bacterial population in the small intestine -- particularly types of bacteria not commonly found in that part of the digestive tract. Small intestinal bacterial overgrowth (SIBO) commonly results when a circumstance -- such as surgery or disease -- slows the passage of food and waste products in the digestive tract, creating a breeding ground for bacteria.  Signs and symptoms of SIBO often include: Loss of appetite Abdominal pain Nausea Bloating An uncomfortable feeling of fullness after eating Diarrhea or constipation, depending on the type of gas produced  What foods trigger SIBO? While foods aren't the original cause of SIBO, certain foods do encourage the overgrowth of the wrong bacteria in your small intestine. If you're feeding them their favorite foods, they're going to grow more, and that will trigger more of your SIBO symptoms. By the same token, you can help reduce the overgrowth by starving the problematic bacteria of their favorite foods. This strategy has led to a number of proposed SIBO eating plans. The plans vary, and so do individual results. But in general, they tend to recommend limiting carbohydrates.  These include: Sugars and sweeteners. Fruits and starchy vegetables. Dairy products. Grains.  There is a test for  this we can do called a breath test, if you are positive we will treat you with an antibiotic to see if it helps.  Your symptoms are very suspicious for this condition, as discussed, we will start you on an antibiotic to see if this helps.

## 2024-01-22 ENCOUNTER — Ambulatory Visit: Payer: Self-pay | Admitting: Physician Assistant

## 2024-01-22 ENCOUNTER — Other Ambulatory Visit

## 2024-01-22 DIAGNOSIS — R1013 Epigastric pain: Secondary | ICD-10-CM

## 2024-01-22 DIAGNOSIS — R197 Diarrhea, unspecified: Secondary | ICD-10-CM

## 2024-01-22 DIAGNOSIS — R195 Other fecal abnormalities: Secondary | ICD-10-CM

## 2024-01-22 LAB — ALPHA-GAL PANEL
Allergen, Mutton, f88: <0.1 kU/L
Allergen, Pork, f26: <0.1 kU/L
Beef: <0.1 kU/L
CLASS: 0
CLASS: 0
Class: 0
GALACTOSE-ALPHA-1,3-GALACTOSE IGE*: <0.1 kU/L (ref <0.10)

## 2024-01-22 LAB — INTERPRETATION:

## 2024-01-25 ENCOUNTER — Ambulatory Visit (HOSPITAL_COMMUNITY): Admission: RE | Admit: 2024-01-25 | Source: Ambulatory Visit

## 2024-01-25 LAB — GIARDIA AND CRYPTOSPORIDIUM ANTIGEN PANEL
MICRO NUMBER:: 16928866
RESULT:: NOT DETECTED
SPECIMEN QUALITY:: ADEQUATE
Specimen Quality:: ADEQUATE
micro Number:: 16928865

## 2024-01-25 LAB — C. DIFFICILE GDH AND TOXIN A/B
GDH ANTIGEN: NOT DETECTED
MICRO NUMBER:: 16929146
SPECIMEN QUALITY:: ADEQUATE
TOXIN A AND B: NOT DETECTED

## 2024-01-26 LAB — STOOL CULTURE: E coli, Shiga toxin Assay: NEGATIVE

## 2024-01-27 LAB — OVA AND PARASITE EXAMINATION
CONCENTRATE RESULT:: NONE SEEN
MICRO NUMBER:: 16928864
SPECIMEN QUALITY:: ADEQUATE
TRICHROME RESULT:: NONE SEEN

## 2024-02-05 ENCOUNTER — Ambulatory Visit (HOSPITAL_COMMUNITY)
Admission: RE | Admit: 2024-02-05 | Discharge: 2024-02-05 | Disposition: A | Discharge status: Home / Self Care | Source: Ambulatory Visit | Attending: Physician Assistant | Admitting: Physician Assistant

## 2024-02-05 DIAGNOSIS — R195 Other fecal abnormalities: Secondary | ICD-10-CM | POA: Insufficient documentation

## 2024-02-05 DIAGNOSIS — R1013 Epigastric pain: Secondary | ICD-10-CM | POA: Insufficient documentation

## 2024-02-05 DIAGNOSIS — R197 Diarrhea, unspecified: Secondary | ICD-10-CM | POA: Insufficient documentation

## 2024-02-14 ENCOUNTER — Encounter: Payer: Self-pay | Admitting: Family

## 2024-02-16 ENCOUNTER — Ambulatory Visit

## 2024-02-16 ENCOUNTER — Ambulatory Visit (INDEPENDENT_AMBULATORY_CARE_PROVIDER_SITE_OTHER)

## 2024-02-16 VITALS — BP 123/72 | HR 72 | Temp 98.2°F | Resp 13 | Ht 68.0 in | Wt 208.0 lb

## 2024-02-16 DIAGNOSIS — R195 Other fecal abnormalities: Secondary | ICD-10-CM

## 2024-02-16 DIAGNOSIS — M545 Low back pain, unspecified: Secondary | ICD-10-CM

## 2024-02-16 DIAGNOSIS — R768 Other specified abnormal immunological findings in serum: Secondary | ICD-10-CM

## 2024-02-16 DIAGNOSIS — R7689 Other specified abnormal immunological findings in serum: Secondary | ICD-10-CM

## 2024-02-16 NOTE — Progress Notes (Signed)
 Office Visit Note  Patient: Matthew Wu             Date of Birth: 30-Dec-1995           MRN: [id number]             PCP: Corwin Antu, FNP Referring: Corwin Antu, FNP Visit Date: 02/16/2024 Occupation: Data Unavailable  Subjective:  New Patient (Initial Visit) (Joint Pain and stiffness. )   History of Present Illness: Matthew Wu is a 28 y.o. male who is presenting for abnormal labs.  He states in 2019 he had severe infection (suspects COVID-19), then had issues such as vertigo, dizziness, headaches, then issues with dysphagia. He admits to fluctuations in diarrhea and then constipation.   He admits to joint pain in his bilateral legs and arms. He states it radiates between his arms and his legs. He states the pain is in the areas between the joints. He admits to pain in his lower back. He states that the pain in his low back started around the same time as his above symptoms, that improves with activity (worse with rest). He states that he occasionally has stiffness in his low back 3x a week that last 30-1 hour. He denies any joint swelling. He also admits to significant stiffness in his legs in the morning when he wakes up.  He admits to some dry patch behind his left knee, no other rashes. He denies any oral ulcers. He denies patchy hair loss. He denies dry eyes. He admits to dry mouth. He denies any fevers. He denies photosensitivity and raynaud's.   Patient with no known family hx of autoimmune diseases.   Activities of Daily Living:  Patient reports morning stiffness for 30 .  minutes Patient Denies nocturnal pain.  Difficulty dressing/grooming: Denies Difficulty climbing stairs: Denies Difficulty getting out of chair: Denies Difficulty using hands for taps, buttons, cutlery, and/or writing: Denies  Review of Systems  Constitutional:  Negative for fatigue.  HENT:  Positive for mouth dryness. Negative for mouth sores.   Eyes:  Negative for dryness.  Respiratory:   Negative for shortness of breath.   Cardiovascular:  Negative for chest pain and palpitations.  Gastrointestinal:  Negative for blood in stool, constipation and diarrhea.  Endocrine: Negative for increased urination.  Genitourinary:  Negative for involuntary urination.  Musculoskeletal:  Positive for joint pain, joint pain, myalgias, muscle weakness, morning stiffness and myalgias. Negative for gait problem, joint swelling and muscle tenderness.  Skin:  Negative for color change, rash, hair loss and sensitivity to sunlight.  Allergic/Immunologic: Negative for susceptible to infections.  Neurological:  Positive for dizziness and headaches.  Hematological:  Negative for swollen glands.  Psychiatric/Behavioral:  Positive for sleep disturbance. Negative for depressed mood. The patient is not nervous/anxious.     PMFS History:  Patient Active Problem List   Diagnosis Date Noted   Cervical radiculopathy 12/25/2023   Excessive daytime sleepiness 12/25/2023   Elevated ALT measurement 12/25/2023   Muscle spasm 12/25/2023   Sleep disorder 12/25/2023   Seasonal allergic rhinitis due to pollen 12/25/2023   Food intolerance 11/09/2023   Fatty liver 11/09/2023   Primary hypertension 09/23/2023   Dysphagia 03/26/2023   Gastroesophageal reflux disease 03/26/2023   Rotator cuff tendinitis, right 02/25/2023   History of non anemic vitamin B12 deficiency 02/10/2023   History of vitamin D  deficiency 02/10/2023   Gastroesophageal reflux disease with esophagitis without hemorrhage 01/06/2023   Overweight (BMI 25.0-29.9) 01/06/2023   Irritable bowel syndrome with  diarrhea 01/06/2023   Anxiety 07/15/2018   Allergy  to environmental factors 05/03/2018   Vitamin D  deficiency 06/01/2015   Hyperlipidemia 06/01/2015    Past Medical History:  Diagnosis Date   Allergy     Pollen   Asthma    GERD (gastroesophageal reflux disease)    Hyperlipidemia    Hypertension     Family History  Problem Relation  Age of Onset   Stroke Mother    Hypertension Mother    Hypertension Maternal Grandmother    Heart attack Maternal Grandmother 31   Dementia Maternal Grandfather    Healthy Daughter    Past Surgical History:  Procedure Laterality Date   BIOPSY  03/26/2023   Procedure: BIOPSY;  Surgeon: Jinny Carmine, MD;  Location: [facility];  Service: Endoscopy;;   ESOPHAGOGASTRODUODENOSCOPY (EGD) WITH PROPOFOL  N/A 03/26/2023   Procedure: ESOPHAGOGASTRODUODENOSCOPY (EGD) WITH PROPOFOL ;  Surgeon: Jinny Carmine, MD;  Location: [facility];  Service: Endoscopy;  Laterality: N/A;   NO PAST SURGERIES     Social History   Tobacco Use   Smoking status: Never    Passive exposure: Past   Smokeless tobacco: Never  Vaping Use   Vaping status: Never Used  Substance Use Topics   Alcohol use: Not Currently   Drug use: No   Social History   Social History Narrative   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  HENT:     Head: Normocephalic and atraumatic.     Nose: Nose normal.  Eyes:     Conjunctiva/sclera: Conjunctivae normal.     Pupils: Pupils are equal, round, and reactive to light.  Cardiovascular:     Rate and Rhythm: Normal rate and regular rhythm.     Heart sounds: Normal heart sounds.  Pulmonary:     Effort: Pulmonary effort is normal.     Breath sounds: Normal breath sounds.  Skin:    General: Skin is warm and dry.     Comments: Digital pitting multiple nailbed bilaterally  Neurological:     Mental Status: He is alert. Mental status is at baseline.  Psychiatric:        Mood and Affect: Mood normal.        Behavior: Behavior normal.      Musculoskeletal Exam:   CDAI Exam: CDAI Score: -- Patient Global: --;  Provider Global: -- Swollen: 0 ; Tender: 2  Joint Exam 02/16/2024      Right  Left  Hip   Tender   Tender     Investigation: No additional findings.  Imaging: US  Abdomen Complete Result Date: 02/07/2024 CLINICAL DATA:  Bloating, abdominal pain common nausea, fatty liver. EXAM: ABDOMEN ULTRASOUND COMPLETE COMPARISON:  Ultrasound January 07, 2023 FINDINGS: Gallbladder: No gallstones or wall thickening visualized. No sonographic Murphy sign noted by sonographer. Common bile duct: Diameter: 2 mm Liver: Diffusely increased hepatic parenchymal echogenicity. Decreased acoustic penetration. Portal vein is patent on color Doppler imaging with normal direction of blood flow towards the liver. IVC: No abnormality visualized. Pancreas: Visualized portion unremarkable. Spleen: Size and appearance within normal limits. Right Kidney: Length: 11.2 cm. Echogenicity within normal limits. No mass  or hydronephrosis visualized. Left Kidney: Length: 11.5 cm. Echogenicity within normal limits. No mass or hydronephrosis visualized. Abdominal aorta: No aneurysm visualized. Other findings: None. IMPRESSION: Diffusely increased hepatic parenchymal echogenicity with decreased acoustic penetration, most commonly seen with hepatic steatosis. Electronically Signed   By: Reyes Holder M.D.   On: 02/07/2024 14:26   DG Abd 1 View Result Date: 01/27/2024 CLINICAL DATA:  Evaluate for stool burden and obstruction. EXAM: ABDOMEN - 1 VIEW COMPARISON:  CT abdomen pelvis dated 01/26/2023. FINDINGS: Moderate colonic stool burden no bowel dilatation or evidence of obstruction. No free air or radiopaque calculi. The osseous structures are intact. The soft tissues are unremarkable. IMPRESSION: Moderate colonic stool burden. No bowel obstruction. Electronically Signed   By: Vanetta Chou M.D.   On: 01/27/2024 21:20    Recent Labs: Lab Results  Component Value Date   WBC 7.3 01/19/2024   HGB 14.6 01/19/2024   PLT 298.0 01/19/2024    NA 142 01/19/2024   K 3.5 01/19/2024   CL 101 01/19/2024   CO2 30 01/19/2024   GLUCOSE 78 01/19/2024   BUN 12 01/19/2024   CREATININE 1.04 01/19/2024   BILITOT 0.4 01/19/2024   ALKPHOS 90 01/19/2024   AST 14 01/19/2024   ALT 31 01/19/2024   PROT 8.0 01/19/2024   ALBUMIN 4.5 01/19/2024   CALCIUM 9.2 01/19/2024   GFRAA 130 05/07/2020    Speciality Comments: No specialty comments available.  Procedures:  No procedures performed Allergies: Pollen extract and Robitussin (alcohol free) [guaifenesin]   Assessment / Plan:     Visit Diagnoses:  Low back pain, concerning for inflammatory process Elevated fecal calprotectin Patient with multiple gastrointestinal symptoms, including an elevated fecal calproctectin, concerning for possible underlying inflammatory process. He does have symptoms of inflammatory back pain (prolonged AM stiffness, improvement with activity) which could suggest a spondyloarthropathy. His XR of his SI joints showed some possible changes of his left SI joint, but no clear evidence of any inflammatory disease or ankyloses. He does have pitting of his nailbeds, which can be seen with a psoriatic arthritis (however no evidence of psoriasis), however his GI symptoms and elevated calprotectin make me concerned for an inflammatory bowel disease and a possible enteropathic arthropathy. Will check HLA-B27 today. Further recommendations pending gastroenterology work-up. May consider MRI of SI joints if GI work-up is not entirely clear.   Positive ANA Patient does not have any features of SLE (no objective malar rash, inflammatory arthritis, Raynaud's, alopecia, recurrent cytopenias, negative dsDNA, smith), Sjogren's disease (negative SSA and SSB), or scleroderma (no sclerodactyly, no Raynaud's).  As discussed with patient, a positive ANA need not represent presence of clinically active systemic autoimmune disease.  Can be positive in the normal population, autoimmune thyroid   disease (Graves' disease, Hashimoto's thyroiditis etc.), or infection. ANA can be positive in the healthy population at the following rates: ANA 1:40: 20%-30%, ANA 1:80: 10%-15%, ANA 1:160: 5%, ANA 1:320: 3% positive, healthy relative of an SLE patient: 5%-25% positive (usually low titers), and elderly (age >70 years): up to 70% positive at ANA titer 1:40.   Orders: Orders Placed This Encounter  Procedures   XR HIPS BILAT W OR W/O PELVIS 3-4 VIEWS   HLA-B27 antigen   No orders of the defined types were placed in this encounter.  I personally spent a total of 60 minutes in the care of the patient today including preparing to see the patient, getting/reviewing separately obtained history, performing a medically appropriate exam/evaluation, counseling and educating, placing orders,  referring and communicating with other health care professionals, documenting clinical information in the EHR, and independently interpreting results.   Follow-Up Instructions: Return in about 3 months (around 05/17/2024).   Asberry Claw, DO  I personally spent a total of 60 minutes in the care of the patient today including preparing to see the patient, getting/reviewing separately obtained history, performing a medically appropriate exam/evaluation, counseling and educating, placing orders, and documenting clinical information in the EHR.

## 2024-02-17 ENCOUNTER — Encounter: Payer: Self-pay | Admitting: Family

## 2024-02-17 DIAGNOSIS — L409 Psoriasis, unspecified: Secondary | ICD-10-CM

## 2024-02-17 LAB — HLA-B27 ANTIGEN: HLA-B27 Antigen: NEGATIVE

## 2024-02-18 DIAGNOSIS — R195 Other fecal abnormalities: Secondary | ICD-10-CM

## 2024-02-18 DIAGNOSIS — R1084 Generalized abdominal pain: Secondary | ICD-10-CM

## 2024-02-18 DIAGNOSIS — R112 Nausea with vomiting, unspecified: Secondary | ICD-10-CM

## 2024-02-18 DIAGNOSIS — R197 Diarrhea, unspecified: Secondary | ICD-10-CM

## 2024-02-18 NOTE — Telephone Encounter (Signed)
 CT enterography order in epic. Secure staff message sent to radiology scheduling to contact patient to set up appt.   Will schedule colonoscopy once patient provides availability.

## 2024-02-19 ENCOUNTER — Telehealth: Payer: Self-pay

## 2024-02-19 DIAGNOSIS — R112 Nausea with vomiting, unspecified: Secondary | ICD-10-CM

## 2024-02-19 DIAGNOSIS — R197 Diarrhea, unspecified: Secondary | ICD-10-CM

## 2024-02-19 DIAGNOSIS — R195 Other fecal abnormalities: Secondary | ICD-10-CM

## 2024-02-19 DIAGNOSIS — R1084 Generalized abdominal pain: Secondary | ICD-10-CM

## 2024-02-19 DIAGNOSIS — K580 Irritable bowel syndrome with diarrhea: Secondary | ICD-10-CM

## 2024-02-19 MED ORDER — NA SULFATE-K SULFATE-MG SULF 17.5-3.13-1.6 GM/177ML PO SOLN: 1.0000 | Freq: Once | ORAL | 0 refills | Status: AC

## 2024-02-19 NOTE — Telephone Encounter (Signed)
 See 10/2 patient message for details.   Colonoscopy has been scheduled with Dr. Suzann in the [facility] on Friday, 03/04/24 arriving at 8:30 am. Patient has access to MyChart and knows to expect instructions today. Patient has been advised that prep has been sent to pharmacy on file.   Suprep sent to [facility] pharmacy. Ambulatory referral to GI in epic. Colonoscopy instructions sent via MyChart.

## 2024-02-21 ENCOUNTER — Ambulatory Visit: Payer: Self-pay

## 2024-02-24 MED ORDER — CLOBETASOL PROPIONATE 0.05 % EX CREA
1.0000 | TOPICAL_CREAM | Freq: Two times a day (BID) | CUTANEOUS | 0 refills | Status: AC
Start: 2024-02-24 — End: ?

## 2024-02-26 ENCOUNTER — Encounter: Payer: Self-pay | Admitting: Pediatrics

## 2024-03-02 ENCOUNTER — Encounter: Admitting: Pediatrics

## 2024-03-03 NOTE — Progress Notes (Signed)
  Gastroenterology History and Physical   Primary Care Physician:  Corwin Antu, FNP   Reason for Procedure:  Abdominal pain, nausea, bloating, diarrhea, elevated fecal calprotectin  Plan:    Colonoscopy     HPI: Matthew Wu is a 28 y.o. male undergoing colonoscopy for investigation of abdominal pain, nausea, bloating and diarrhea.  In the course of evaluation patient was noted to have elevated fecal calprotectin 502 concerning for inflammatory bowel disease.  Patient also has a history of GERD with previous negative H. pylori testing, CT scan, HIDA scan and EGD.   Past Medical History:  Diagnosis Date   Allergy     Pollen   Asthma    GERD (gastroesophageal reflux disease)    Hyperlipidemia    Hypertension     Past Surgical History:  Procedure Laterality Date   BIOPSY  03/26/2023   Procedure: BIOPSY;  Surgeon: Jinny Carmine, MD;  Location: [facility];  Service: Endoscopy;;   ESOPHAGOGASTRODUODENOSCOPY (EGD) WITH PROPOFOL  N/A 03/26/2023   Procedure: ESOPHAGOGASTRODUODENOSCOPY (EGD) WITH PROPOFOL ;  Surgeon: Jinny Carmine, MD;  Location: [facility];  Service: Endoscopy;  Laterality: N/A;   NO PAST SURGERIES      Prior to Admission medications   Medication Sig Start Date End Date Taking? Authorizing Provider  aluminum-magnesium hydroxide 200-200 MG/5ML suspension Take by mouth every 6 (six) hours as needed for indigestion. Patient not taking: Reported on 02/16/2024    [provider]  amLODipine  (NORVASC ) 10 MG tablet Take 1 tablet (10 mg total) by mouth daily. 09/23/23   Corwin Antu, FNP  cetirizine  (ZYRTEC ) 10 MG tablet Take 1 tablet (10 mg total) by mouth daily. Patient taking differently: Take 10 mg by mouth as needed. 12/25/23   Dugal, Tabitha, FNP  Cholecalciferol  1.25 MG (50000 UT) TABS Take 1 tablet by mouth once a week. 12/28/23   Dugal, Tabitha, FNP  clobetasol cream (TEMOVATE) 0.05 % Apply 1 Application topically 2 (two) times daily. 02/24/24    Dugal, Tabitha, FNP  dicyclomine  (BENTYL ) 20 MG tablet Take 1 tablet (20 mg total) by mouth 3 (three) times daily as needed for spasms. 01/19/24   Craig Alan SAUNDERS, PA-C  famotidine  (PEPCID ) 20 MG tablet Take 1 tablet (20 mg total) by mouth 2 (two) times daily. 02/18/23   [place], PA-C  hydrochlorothiazide  (HYDRODIURIL ) 25 MG tablet Take 1 tab daily in the morning for blood pressure goal < 130/70 09/23/23   Corwin Antu, FNP  metaxalone  (SKELAXIN ) 800 MG tablet Take 1/2 to one tablet po at bedtime prn muscle relaxer 12/25/23   Dugal, Tabitha, FNP  pantoprazole  (PROTONIX ) 40 MG tablet TAKE 1 TABLET BY MOUTH 2 TIMES A DAY 11/23/23   Corwin Antu, FNP  traZODone  (DESYREL ) 50 MG tablet Take 0.5-1 tablets (25-50 mg total) by mouth at bedtime as needed for sleep. Patient not taking: Reported on 02/16/2024 12/25/23   Dugal, Tabitha, FNP    Current Outpatient Medications  Medication Sig Dispense Refill   aluminum-magnesium hydroxide 200-200 MG/5ML suspension Take by mouth every 6 (six) hours as needed for indigestion. (Patient not taking: Reported on 02/16/2024)     amLODipine  (NORVASC ) 10 MG tablet Take 1 tablet (10 mg total) by mouth daily. 90 tablet 3   cetirizine  (ZYRTEC ) 10 MG tablet Take 1 tablet (10 mg total) by mouth daily. (Patient taking differently: Take 10 mg by mouth as needed.) 30 tablet 11   Cholecalciferol  1.25 MG (50000 UT) TABS Take 1 tablet by mouth once a week. 12 tablet 0  clobetasol cream (TEMOVATE) 0.05 % Apply 1 Application topically 2 (two) times daily. 30 g 0   dicyclomine  (BENTYL ) 20 MG tablet Take 1 tablet (20 mg total) by mouth 3 (three) times daily as needed for spasms. 50 tablet 0   famotidine  (PEPCID ) 20 MG tablet Take 1 tablet (20 mg total) by mouth 2 (two) times daily.     hydrochlorothiazide  (HYDRODIURIL ) 25 MG tablet Take 1 tab daily in the morning for blood pressure goal < 130/70 90 tablet 3   metaxalone  (SKELAXIN ) 800 MG tablet Take 1/2 to one tablet po at bedtime prn  muscle relaxer 20 tablet 0   pantoprazole  (PROTONIX ) 40 MG tablet TAKE 1 TABLET BY MOUTH 2 TIMES A DAY 180 tablet 0   traZODone  (DESYREL ) 50 MG tablet Take 0.5-1 tablets (25-50 mg total) by mouth at bedtime as needed for sleep. (Patient not taking: Reported on 02/16/2024) 30 tablet 3   No current facility-administered medications for this visit.    Allergies as of 03/04/2024 - Review Complete 02/16/2024  Allergen Reaction Noted   Pollen extract  12/22/2021   Robitussin (alcohol free) [guaifenesin] Nausea And Vomiting 05/30/2015    Family History  Problem Relation Age of Onset   Stroke Mother    Hypertension Mother    Hypertension Maternal Grandmother    Heart attack Maternal Grandmother 42   Dementia Maternal Grandfather    Healthy Daughter     Social History   Socioeconomic History   Marital status: Significant Other    Spouse name: Not on file   Number of children: 1   Years of education: Not on file   Highest education level: 12th grade  Occupational History    Employer: [employer]    Comment: produce  Tobacco Use   Smoking status: Never    Passive exposure: Past   Smokeless tobacco: Never  Vaping Use   Vaping status: Never Used  Substance and Sexual Activity   Alcohol use: Not Currently   Drug use: No   Sexual activity: Yes    Partners: Male    Birth control/protection: Coitus interruptus  Other Topics Concern   Not on file  Social History Narrative   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   Social Drivers of Corporate investment banker Strain: Low Risk  (01/06/2023)   Overall Financial Resource Strain (CARDIA)    Difficulty of Paying Living Expenses: Not very hard  Food Insecurity: No Food Insecurity (01/06/2023)   Hunger Vital Sign    Worried About Running Out of Food in the Last Year: Never true    Ran Out of Food in the Last Year: Never true  Transportation Needs: No Transportation Needs (01/06/2023)   PRAPARE - Administrator, Civil Service (Medical):  No    Lack of Transportation (Non-Medical): No  Physical Activity: Insufficiently Active (01/06/2023)   Exercise Vital Sign    Days of Exercise per Week: 3 days    Minutes of Exercise per Session: 30 min  Stress: No Stress Concern Present (01/06/2023)   [employer] of Occupational Health - Occupational Stress Questionnaire    Feeling of Stress : Only a little  Social Connections: Unknown (01/06/2023)   Social Connection and Isolation Panel    Frequency of Communication with Friends and Family: Three times a week    Frequency of Social Gatherings with Friends and Family: Once a week    Attends Religious Services: Never    Database administrator or Organizations: No  Attends Banker Meetings: Not on file    Marital Status: Patient declined  Intimate Partner Violence: Not on file    Review of Systems:  All other review of systems negative except as mentioned in the HPI.  Physical Exam: Vital signs There were no vitals taken for this visit.  General:   Alert,  Well-developed, well-nourished, pleasant and cooperative in NAD Airway:  Mallampati 1 Lungs:  Clear throughout to auscultation.   Heart:  Regular rate and rhythm; no murmurs, clicks, rubs,  or gallops. Abdomen:  Soft, nontender and nondistended. Normal bowel sounds.   Neuro/Psych:  Normal mood and affect. A and O x 3  Inocente Hausen, MD [facility]astroenter[facility]

## 2024-03-04 ENCOUNTER — Ambulatory Visit (AMBULATORY_SURGERY_CENTER): Admitting: Pediatrics

## 2024-03-04 ENCOUNTER — Encounter: Payer: Self-pay | Admitting: Pediatrics

## 2024-03-04 VITALS — BP 145/98 | HR 62 | Temp 98.2°F | Resp 13 | Ht 67.0 in | Wt 207.0 lb

## 2024-03-04 DIAGNOSIS — R195 Other fecal abnormalities: Secondary | ICD-10-CM | POA: Diagnosis not present

## 2024-03-04 DIAGNOSIS — R197 Diarrhea, unspecified: Secondary | ICD-10-CM

## 2024-03-04 DIAGNOSIS — R140 Abdominal distension (gaseous): Secondary | ICD-10-CM

## 2024-03-04 DIAGNOSIS — D122 Benign neoplasm of ascending colon: Secondary | ICD-10-CM

## 2024-03-04 DIAGNOSIS — K580 Irritable bowel syndrome with diarrhea: Secondary | ICD-10-CM

## 2024-03-04 DIAGNOSIS — R1084 Generalized abdominal pain: Secondary | ICD-10-CM

## 2024-03-04 MED ORDER — SODIUM CHLORIDE 0.9 % IV SOLN: 500.0000 mL | Freq: Once | INTRAVENOUS | Status: DC

## 2024-03-04 NOTE — Progress Notes (Signed)
 Called to room to assist during endoscopic procedure.  Patient ID and intended procedure confirmed with present staff. Received instructions for my participation in the procedure from the performing physician.

## 2024-03-04 NOTE — Progress Notes (Signed)
   Established Patient Office Visit  Subjective   Patient ID: Matthew Wu, male    DOB: [birth year]  Age: 28 y.o. MRN: [id number]  Chief Complaint  Patient presents with   Colonoscopy    Diarrhea/IBS/Dugal    HPI    ROS    Objective:     BP (!) 140/82   Pulse 84   Temp 98.2 F (36.8 C) (Skin)   Resp 16   Ht 5' 7 (1.702 m)   Wt 207 lb (93.9 kg)   SpO2 100%   BMI 32.42 kg/m    Physical Exam   No results found for any visits on 03/04/24.    The ASCVD Risk score (Arnett DK, et al., 2019) failed to calculate for the following reasons:   The 2019 ASCVD risk score is only valid for ages 31 to 27    Assessment & Plan:   Problem List Items Addressed This Visit       Digestive   Irritable bowel syndrome with diarrhea   Relevant Medications   0.9 %  sodium chloride  infusion   Other Relevant Orders   Diet NPO   Hypoglycemia/Hyperglycemia protocol   Emergency Medications   ECG and pulse monitoring    Monitor NIBP   Notify physician   Monitor O2 SATs   NPO status   Vital signs   Monitor NIBP, ECG and PSA02   Notify physician   Discharge home with responsible adult.   Discharge instructions   Colonoscopy: verify informed consent   Other Visit Diagnoses       Diarrhea, unspecified type    -  Primary   Relevant Medications   0.9 %  sodium chloride  infusion   Other Relevant Orders   Diet NPO   Hypoglycemia/Hyperglycemia protocol   Emergency Medications   ECG and pulse monitoring    Monitor NIBP   Notify physician   Monitor O2 SATs   NPO status   Vital signs   Monitor NIBP, ECG and PSA02   Notify physician   Discharge home with responsible adult.   Discharge instructions   Colonoscopy: verify informed consent       No follow-ups on file.    Ninette Bernice Morrison, RN

## 2024-03-04 NOTE — Op Note (Signed)
 [facility] Patient Name: Matthew Wu Procedure Date: 03/04/2024 9:29 AM MRN: [id number] Endoscopist: Inocente Hausen , MD, [id number] Age: 28 Referring MD:  Date of Birth: Sep 15, 1995 Gender: Male Account #: [id number] Procedure:                Colonoscopy Indications:              This is the patient's first colonoscopy,                            Generalized abdominal pain, Diarrhea, Bloating,                            Nausea, Elevated fecal calprotectin 502 Medicines:                Monitored Anesthesia Care Procedure:                Pre-Anesthesia Assessment:                           - Prior to the procedure, a History and Physical                            was performed, and patient medications and                            allergies were reviewed. The patient's tolerance of                            previous anesthesia was also reviewed. The risks                            and benefits of the procedure and the sedation                            options and risks were discussed with the patient.                            All questions were answered, and informed consent                            was obtained. Prior Anticoagulants: The patient has                            taken no anticoagulant or antiplatelet agents. ASA                            Grade Assessment: II - A patient with mild systemic                            disease. After reviewing the risks and benefits,                            the patient was deemed in satisfactory condition to  undergo the procedure.                           After obtaining informed consent, the colonoscope                            was passed under direct vision. Throughout the                            procedure, the patient's blood pressure, pulse, and                            oxygen saturations were monitored continuously. The                            CF [id number] #[id number] was  introduced through the anus                            and advanced to the 20 cm into the ileum. The                            colonoscopy was performed without difficulty. The                            patient tolerated the procedure well. The quality                            of the bowel preparation was good. The terminal                            ileum, ileocecal valve, appendiceal orifice, and                            rectum were photographed. Scope In: 9:35:34 AM Scope Out: 9:48:55 AM Scope Withdrawal Time: 0 hours 11 minutes 12 seconds  Total Procedure Duration: 0 hours 13 minutes 21 seconds  Findings:                 The perianal and digital rectal examinations were                            normal. Pertinent negatives include normal                            sphincter tone and no palpable rectal lesions.                           Normal mucosa was found in the entire colon.                            Biopsies were taken with a cold forceps for                            histology.  A 4 mm polyp was found in the ascending colon. The                            polyp was sessile. The polyp was removed with a                            cold biopsy forceps. Resection and retrieval were                            complete.                           The terminal ileum appeared normal. Biopsies were                            taken with a cold forceps for histology.                           The retroflexed view of the distal rectum and anal                            verge was normal and showed no anal or rectal                            abnormalities. Complications:            No immediate complications. Estimated blood loss:                            Minimal. Estimated Blood Loss:     Estimated blood loss was minimal. Impression:               - Normal mucosa in the entire examined colon.                            Biopsied.                            - One 4 mm polyp in the ascending colon, removed                            with a cold biopsy forceps. Resected and retrieved.                           - The examined portion of the ileum was normal.                            Biopsied.                           - The distal rectum and anal verge are normal on                            retroflexion view. Recommendation:           - Discharge patient to home (  ambulatory).                           - Await pathology results.                           - Schedule CTE +/- VCE                           - The findings and recommendations were discussed                            with the patient's family.                           - Patient has a contact number available for                            emergencies. The signs and symptoms of potential                            delayed complications were discussed with the                            patient. Return to normal activities tomorrow.                            Written discharge instructions were provided to the                            patient. Inocente Hausen, MD 03/04/2024 9:54:04 AM This report has been signed electronically.

## 2024-03-04 NOTE — Progress Notes (Signed)
 Report given to PACU, vss

## 2024-03-04 NOTE — Patient Instructions (Signed)
 Resume all of your previous medications today as ordered. The office will arrange  the CTE/VCE. They will call you. The biopsy results will be back in about two weeks. Read your discharge instructions.     YOU HAD AN ENDOSCOPIC PROCEDURE TODAY AT THE [facility]:   Refer to the procedure report that was given to you for any specific questions about what was found during the examination.  If the procedure report does not answer your questions, please call your gastroenterologist to clarify.  If you requested that your care partner not be given the details of your procedure findings, then the procedure report has been included in a sealed envelope for you to review at your convenience later.  YOU SHOULD EXPECT: Some feelings of bloating in the abdomen. Passage of more gas than usual.  Walking can help get rid of the air that was put into your GI tract during the procedure and reduce the bloating. If you had a lower endoscopy (such as a colonoscopy or flexible sigmoidoscopy) you may notice spotting of blood in your stool or on the toilet paper. If you underwent a bowel prep for your procedure, you may not have a normal bowel movement for a few days.  Please Note:  You might notice some irritation and congestion in your nose or some drainage.  This is from the oxygen used during your procedure.  There is no need for concern and it should clear up in a day or so.  SYMPTOMS TO REPORT IMMEDIATELY:  Following lower endoscopy (colonoscopy or flexible sigmoidoscopy):  Excessive amounts of blood in the stool  Significant tenderness or worsening of abdominal pains  Swelling of the abdomen that is new, acute  Fever of 100F or higher   For urgent or emergent issues, a gastroenterologist can be reached at any hour by calling [phone]. Do not use MyChart messaging for urgent concerns.    DIET:  We do recommend a small meal at first, but then you may proceed to your regular diet.  Drink  plenty of fluids but you should avoid alcoholic beverages for 24 hours.  ACTIVITY:  You should plan to take it easy for the rest of today and you should NOT DRIVE or use heavy machinery until tomorrow (because of the sedation medicines used during the test).    FOLLOW UP: Our staff will call the number listed on your records the next business day following your procedure.  We will call around 7:15- 8:00 am to check on you and address any questions or concerns that you may have regarding the information given to you following your procedure. If we do not reach you, we will leave a message.     If any biopsies were taken you will be contacted by phone or by letter within the next 1-3 weeks.  Please call us  at [phone] if you have not heard about the biopsies in 3 weeks.    SIGNATURES/CONFIDENTIALITY: You and/or your care partner have signed paperwork which will be entered into your electronic medical record.  These signatures attest to the fact that that the information above on your After Visit Summary has been reviewed and is understood.  Full responsibility of the confidentiality of this discharge information lies with you and/or your care-partner.

## 2024-03-07 ENCOUNTER — Telehealth: Payer: Self-pay

## 2024-03-07 NOTE — Telephone Encounter (Signed)
  Follow up Call-     03/04/2024    8:42 AM  Call back number  Post procedure Call Back phone  # [phone]  Permission to leave phone message Yes     Patient questions:  Do you have a fever, pain , or abdominal swelling? No. Pain Score  0 *  Have you tolerated food without any problems? Yes.    Have you been able to return to your normal activities? Yes.    Do you have any questions about your discharge instructions: Diet   No. Medications  No. Follow up visit  No.  Do you have questions or concerns about your Care? No.  Actions: * If pain score is 4 or above: No action needed, pain <4.

## 2024-03-08 LAB — SURGICAL PATHOLOGY

## 2024-03-09 ENCOUNTER — Ambulatory Visit: Payer: Self-pay | Admitting: Pediatrics

## 2024-03-09 ENCOUNTER — Telehealth: Payer: Self-pay

## 2024-03-09 DIAGNOSIS — R197 Diarrhea, unspecified: Secondary | ICD-10-CM

## 2024-03-09 DIAGNOSIS — R140 Abdominal distension (gaseous): Secondary | ICD-10-CM

## 2024-03-09 DIAGNOSIS — R112 Nausea with vomiting, unspecified: Secondary | ICD-10-CM

## 2024-03-09 DIAGNOSIS — R1084 Generalized abdominal pain: Secondary | ICD-10-CM

## 2024-03-09 DIAGNOSIS — R195 Other fecal abnormalities: Secondary | ICD-10-CM

## 2024-03-09 NOTE — Telephone Encounter (Signed)
 I spoke to [place] in radiology scheduling.  She scheduled that CT Scan on Friday, 03/18/24 @ 2pm but patient needs to arrive at 12:45 pm for registration.

## 2024-03-09 NOTE — Telephone Encounter (Signed)
 I spoke to Matthew Wu and I advised him that Dr. Suzann would like for him to have a CT Enterography completed.  I asked what day/ time is best and he said Friday's after 9 am.  I told him that I will schedule and then I will send him the details through MyChart.

## 2024-03-14 ENCOUNTER — Ambulatory Visit (INDEPENDENT_AMBULATORY_CARE_PROVIDER_SITE_OTHER): Admitting: Family

## 2024-03-14 ENCOUNTER — Encounter: Payer: Self-pay | Admitting: Family

## 2024-03-14 VITALS — BP 132/84 | HR 83 | Temp 98.8°F | Ht 69.0 in | Wt 205.6 lb

## 2024-03-14 DIAGNOSIS — L409 Psoriasis, unspecified: Secondary | ICD-10-CM | POA: Insufficient documentation

## 2024-03-14 DIAGNOSIS — Z202 Contact with and (suspected) exposure to infections with a predominantly sexual mode of transmission: Secondary | ICD-10-CM

## 2024-03-14 DIAGNOSIS — N4889 Other specified disorders of penis: Secondary | ICD-10-CM | POA: Diagnosis not present

## 2024-03-14 DIAGNOSIS — L309 Dermatitis, unspecified: Secondary | ICD-10-CM | POA: Insufficient documentation

## 2024-03-14 MED ORDER — TRIAMCINOLONE ACETONIDE 0.5 % EX OINT: 1.0000 | TOPICAL_OINTMENT | Freq: Two times a day (BID) | CUTANEOUS | 0 refills | Status: AC

## 2024-03-14 NOTE — Progress Notes (Signed)
 i  Established Patient Office Visit  Subjective:      CC:  Chief Complaint  Patient presents with   Acute Visit    Dry skin patches in genital area    HPI: Matthew Wu is a 28 y.o. male presenting on 03/14/2024 for Acute Visit (Dry skin patches in genital area) .  Discussed the use of AI scribe software for clinical note transcription with the patient, who gave verbal consent to proceed.  History of Present Illness Matthew Wu is a 28 year old male who presents with itchy skin patches.  He has been experiencing itchy patches on his skin, initially suspecting psoriasis due to a previous rheumatologist's observation of nail pitting. The itching began behind his leg and has since spread to his arms, genitals, and other areas. The patches are not particularly scaly and tend to come and go, sometimes appearing worse in certain areas.  He has been using coconut oil to alleviate the itching, which provides some relief. No recent changes in fabric or soaps that could have contributed to the condition. The itching is more intense in the genital area compared to other parts of his body.  During the review of symptoms, the patient reports he has not seen any lesions in the affected areas, and the skin feels less scaly after applying coconut oil. The itching is not present in the creases of his skin but is more generalized.         Social history:  Relevant past medical, surgical, family and social history reviewed and updated as indicated. Interim medical history since our last visit reviewed.  Allergies and medications reviewed and updated.  DATA REVIEWED: CHART IN EPIC     ROS: Negative unless specifically indicated above in HPI.    Current Outpatient Medications:    amLODipine  (NORVASC ) 10 MG tablet, Take 1 tablet (10 mg total) by mouth daily., Disp: 90 tablet, Rfl: 3   cetirizine  (ZYRTEC ) 10 MG tablet, Take 1 tablet (10 mg total) by mouth daily. (Patient taking  differently: Take 10 mg by mouth as needed.), Disp: 30 tablet, Rfl: 11   Cholecalciferol  1.25 MG (50000 UT) TABS, Take 1 tablet by mouth once a week., Disp: 12 tablet, Rfl: 0   clobetasol cream (TEMOVATE) 0.05 %, Apply 1 Application topically 2 (two) times daily., Disp: 30 g, Rfl: 0   famotidine  (PEPCID ) 20 MG tablet, Take 1 tablet (20 mg total) by mouth 2 (two) times daily., Disp: , Rfl:    hydrochlorothiazide  (HYDRODIURIL ) 25 MG tablet, Take 1 tab daily in the morning for blood pressure goal < 130/70, Disp: 90 tablet, Rfl: 3   metaxalone  (SKELAXIN ) 800 MG tablet, Take 1/2 to one tablet po at bedtime prn muscle relaxer, Disp: 20 tablet, Rfl: 0   pantoprazole  (PROTONIX ) 40 MG tablet, TAKE 1 TABLET BY MOUTH 2 TIMES A DAY, Disp: 180 tablet, Rfl: 0   triamcinolone ointment (KENALOG) 0.5 %, Apply 1 Application topically 2 (two) times daily., Disp: 30 g, Rfl: 0        Objective:        BP 132/84 (BP Location: Left Arm, Patient Position: Sitting, Cuff Size: Large)   Pulse 83   Temp 98.8 F (37.1 C) (Temporal)   Ht 5' 9 (1.753 m)   Wt 205 lb 9.6 oz (93.3 kg)   SpO2 98%   BMI 30.36 kg/m   Physical Exam GENITOURINARY: Peripapular penile lesions at the tip of the penis. SKIN: Psoriasis patches on skin.  Wt Readings  from Last 3 Encounters:  03/14/24 205 lb 9.6 oz (93.3 kg)  03/04/24 207 lb (93.9 kg)  02/16/24 208 lb (94.3 kg)    Physical Exam Exam conducted with a chaperone present.  Constitutional:      Appearance: Normal appearance. He is normal weight.  Genitourinary:    Penis: Circumcised. Lesions (skin pigmented papular lesions at base of penis head) present.      Testes: Normal.     Comments: Some slight scaly texture to left lateral side of left testicle Some scaly hypopigmentation to right inguinal area  Neurological:     Mental Status: He is alert.          Results   Assessment & Plan:   Assessment and Plan Assessment & Plan Psoriasis Presents with itchy  patches on arms, behind legs, and genitals. Nail pitting suggests psoriasis. Lesions are non-scaly, intermittent, with flare-ups. Condition onset post previous visit for gastrointestinal issues. Using coconut oil for relief. - Refer to rheumatology for further evaluation. - Advise clobetasol application on affected areas, excluding genitals, as needed. - Recommend applying Aquaphor post-steroid to maintain moisture.  Ectopic eczema or dermatitis Suspected ectopic eczema or dermatitis in genital area, possibly lichenified from scratching. Lesions are itchy, foreskin most affected. No change in underwear fabric or new soaps used. - Prescribe triamcinolone 0.05% cream, apply twice daily for seven days. Advised not to apply to shaft or penile head - Discuss potential side effect of hypopigmentation. - Advise keeping area dry and moisture-free. - Instruct to report if symptoms do not improve or worsen in 2-3 days to consider fungal etiology.  Peripapular penile lesions Lesions at penile tip likely peripapular penile lesions, typically benign. Lesions appear bumpy, no shaving in area.  Sexually transmitted disease screening Does not suspect STD but consents to testing.        Return if symptoms worsen or fail to improve.     Ginger Patrick, MSN, APRN, FNP-C [facility]

## 2024-03-15 ENCOUNTER — Ambulatory Visit: Admitting: Physician Assistant

## 2024-03-15 LAB — HIV ANTIBODY (ROUTINE TESTING W REFLEX)
HIV 1&2 Ab, 4th Generation: NONREACTIVE
HIV FINAL INTERPRETATION: NEGATIVE

## 2024-03-15 LAB — HEPATITIS C ANTIBODY: Hepatitis C Ab: NONREACTIVE

## 2024-03-15 LAB — HSV 1 AND 2 AB, IGG
HSV 1 Glycoprotein G Ab, IgG: NONREACTIVE
HSV 2 IgG, Type Spec: NONREACTIVE

## 2024-03-15 LAB — RPR: RPR Ser Ql: NONREACTIVE

## 2024-03-15 NOTE — Telephone Encounter (Signed)
 Inbound call from [facility] requesting referral sent for ct with contrast to [facility]   P - [phone] F - [phone]  Please advise

## 2024-03-16 ENCOUNTER — Ambulatory Visit: Payer: Self-pay | Admitting: Family

## 2024-03-16 LAB — CHLAMYDIA/GONOCOCCUS/TRICHOMONAS, NAA
Chlamydia by NAA: NEGATIVE
Gonococcus by NAA: NEGATIVE
Trich vag by NAA: NEGATIVE

## 2024-03-16 NOTE — Addendum Note (Signed)
 Addended by: MERCER CRISTINO SAILOR on: 03/16/2024 03:43 PM   Modules accepted: Orders

## 2024-03-16 NOTE — Telephone Encounter (Signed)
 CT entero order has been faxed to [facility] via epic fax as requested. They will contact patient to schedule once order is received. MyChart message to patient.

## 2024-03-17 ENCOUNTER — Ambulatory Visit: Admitting: Family

## 2024-03-17 ENCOUNTER — Other Ambulatory Visit: Payer: Self-pay | Admitting: Family

## 2024-03-17 DIAGNOSIS — E559 Vitamin D deficiency, unspecified: Secondary | ICD-10-CM

## 2024-03-17 NOTE — Telephone Encounter (Signed)
 [facility] called today to ask that the CT order is sent to them. Fax to [phone]

## 2024-03-18 ENCOUNTER — Other Ambulatory Visit: Payer: Self-pay | Admitting: Family

## 2024-03-18 ENCOUNTER — Ambulatory Visit (HOSPITAL_COMMUNITY)

## 2024-03-18 DIAGNOSIS — E559 Vitamin D deficiency, unspecified: Secondary | ICD-10-CM

## 2024-03-18 NOTE — Telephone Encounter (Signed)
 Order faxed to [facility] as patient requested.

## 2024-05-05 NOTE — Progress Notes (Deleted)
 Office Visit Note  Patient: Matthew Wu             Date of Birth: 04/12/1996           MRN: [id number]             PCP: Matthew Antu, FNP Referring: Matthew Antu, FNP Visit Date: 05/17/2024 Occupation: Data Unavailable  Subjective:  No chief complaint on file.   History of Present Illness: Matthew Wu is a 28 y.o. male ***     Activities of Daily Living:  Patient reports morning stiffness for *** {minute/hour:19697}.   Patient {ACTIONS;DENIES/REPORTS:21021675::Denies} nocturnal pain.  Difficulty dressing/grooming: {ACTIONS;DENIES/REPORTS:21021675::Denies} Difficulty climbing stairs: {ACTIONS;DENIES/REPORTS:21021675::Denies} Difficulty getting out of chair: {ACTIONS;DENIES/REPORTS:21021675::Denies} Difficulty using hands for taps, buttons, cutlery, and/or writing: {ACTIONS;DENIES/REPORTS:21021675::Denies}  No Rheumatology ROS completed.   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 03/14/2024   Cervical radiculopathy 12/25/2023   Excessive daytime sleepiness 12/25/2023   Elevated ALT measurement 12/25/2023   Muscle spasm 12/25/2023   Sleep disorder 12/25/2023   Seasonal allergic rhinitis due to pollen 12/25/2023   Food intolerance 11/09/2023   Fatty liver 11/09/2023   Primary hypertension 09/23/2023   Dysphagia 03/26/2023   Gastroesophageal reflux disease 03/26/2023   Rotator cuff tendinitis, right 02/25/2023   History of non anemic vitamin B12 deficiency 02/10/2023   History of vitamin D  deficiency 02/10/2023   Gastroesophageal reflux disease with esophagitis without hemorrhage 01/06/2023   Overweight (BMI 25.0-29.9) 01/06/2023   Irritable bowel syndrome with diarrhea 01/06/2023   Anxiety 07/15/2018   Allergy  to environmental factors 05/03/2018   Vitamin D  deficiency 06/01/2015   Hyperlipidemia 06/01/2015    Past Medical History:  Diagnosis Date   Allergy     Pollen    Anxiety    Asthma    GERD (gastroesophageal reflux disease)    Hyperlipidemia    Hypertension     Family History  Problem Relation Age of Onset   Stroke Mother    Hypertension Mother    Hypertension Maternal Grandmother    Heart attack Maternal Grandmother 74   Dementia Maternal Grandfather    Healthy Daughter    Colon cancer Neg Hx    Esophageal cancer Neg Hx    Rectal cancer Neg Hx    Stomach cancer Neg Hx    Past Surgical History:  Procedure Laterality Date   BIOPSY  03/26/2023   Procedure: BIOPSY;  Surgeon: Jinny Carmine, MD;  Location: [facility];  Service: Endoscopy;;   ESOPHAGOGASTRODUODENOSCOPY (EGD) WITH PROPOFOL  N/A 03/26/2023   Procedure: ESOPHAGOGASTRODUODENOSCOPY (EGD) WITH PROPOFOL ;  Surgeon: Jinny Carmine, MD;  Location: [facility];  Service: Endoscopy;  Laterality: N/A;   NO PAST SURGERIES     UPPER GASTROINTESTINAL ENDOSCOPY     Social History[1] Social History   Social History Narrative   One 29 y/o daugther naia     Immunization History  Administered Date(s) Administered   Tdap 01/06/2023     Objective: Vital Signs: There were no vitals taken for this visit.   Physical Exam   Musculoskeletal Exam: ***  CDAI Exam: CDAI Score: -- Patient Global: --; Provider Global: -- Swollen: --; Tender: -- Joint Exam 05/17/2024   No joint exam has been documented for this visit   There is currently no information documented on the homunculus. Go to the Rheumatology activity and complete the homunculus joint exam.  Investigation: No additional findings.  Imaging: No results found.  Recent Labs: Lab  Results  Component Value Date   WBC 7.3 01/19/2024   HGB 14.6 01/19/2024   PLT 298.0 01/19/2024   NA 142 01/19/2024   K 3.5 01/19/2024   CL 101 01/19/2024   CO2 30 01/19/2024   GLUCOSE 78 01/19/2024   BUN 12 01/19/2024   CREATININE 1.04 01/19/2024   BILITOT 0.4 01/19/2024   ALKPHOS 90 01/19/2024   AST 14 01/19/2024   ALT 31  01/19/2024   PROT 8.0 01/19/2024   ALBUMIN 4.5 01/19/2024   CALCIUM 9.2 01/19/2024   GFRAA 130 05/07/2020    Speciality Comments: No specialty comments available.  Procedures:  No procedures performed Allergies: Pollen extract and Robitussin (alcohol free) [guaifenesin]   Assessment / Plan:     Visit Diagnoses: No diagnosis found.  Orders: No orders of the defined types were placed in this encounter.  No orders of the defined types were placed in this encounter.   Face-to-face time spent with patient was *** minutes. Greater than 50% of time was spent in counseling and coordination of care.  Follow-Up Instructions: No follow-ups on file.   Alfonso Patterson, LPN  Note - This record has been created using [employer].  Chart creation errors have been sought, but may not always  have been located. Such creation errors do not reflect on  the standard of medical care.    [1]  Social History Tobacco Use   Smoking status: Never    Passive exposure: Past   Smokeless tobacco: Never  Vaping Use   Vaping status: Never Used  Substance Use Topics   Alcohol use: Not Currently   Drug use: No

## 2024-05-17 ENCOUNTER — Ambulatory Visit

## 2024-05-27 ENCOUNTER — Other Ambulatory Visit: Payer: Self-pay

## 2024-06-11 ENCOUNTER — Other Ambulatory Visit: Payer: Self-pay | Admitting: Family
